# Patient Record
Sex: Female | Born: 1975 | Race: White | Hispanic: No | Marital: Single | Smoking: Never smoker
Health system: Southern US, Community
[De-identification: ages and names within clinical notes are randomized; demographics above are authoritative.]

## PROBLEM LIST (undated history)

## (undated) DIAGNOSIS — R030 Elevated blood-pressure reading, without diagnosis of hypertension: Secondary | ICD-10-CM

## (undated) DIAGNOSIS — J45909 Unspecified asthma, uncomplicated: Secondary | ICD-10-CM

## (undated) DIAGNOSIS — L719 Rosacea, unspecified: Secondary | ICD-10-CM

## (undated) HISTORY — DX: Rosacea, unspecified: L71.9

## (undated) HISTORY — DX: Morbid (severe) obesity due to excess calories: E66.01

## (undated) HISTORY — DX: Unspecified asthma, uncomplicated: J45.909

## (undated) HISTORY — DX: Elevated blood-pressure reading, without diagnosis of hypertension: R03.0

## (undated) HISTORY — PX: OTHER SURGICAL HISTORY: SHX169

---

## 1999-03-04 ENCOUNTER — Other Ambulatory Visit: Admission: RE | Admit: 1999-03-04 | Discharge: 1999-03-04 | Payer: Self-pay | Admitting: *Deleted

## 2001-02-13 ENCOUNTER — Other Ambulatory Visit: Admission: RE | Admit: 2001-02-13 | Discharge: 2001-02-13 | Payer: Self-pay | Admitting: Obstetrics and Gynecology

## 2002-09-03 ENCOUNTER — Other Ambulatory Visit: Admission: RE | Admit: 2002-09-03 | Discharge: 2002-09-03 | Payer: Self-pay | Admitting: Obstetrics and Gynecology

## 2007-02-28 ENCOUNTER — Other Ambulatory Visit: Admission: RE | Admit: 2007-02-28 | Discharge: 2007-02-28 | Payer: Self-pay | Admitting: Obstetrics & Gynecology

## 2008-04-20 ENCOUNTER — Other Ambulatory Visit: Admission: RE | Admit: 2008-04-20 | Discharge: 2008-04-20 | Payer: Self-pay | Admitting: Obstetrics & Gynecology

## 2012-09-03 ENCOUNTER — Encounter: Payer: Self-pay | Admitting: Certified Nurse Midwife

## 2012-09-09 ENCOUNTER — Encounter: Payer: Self-pay | Admitting: Certified Nurse Midwife

## 2012-09-09 ENCOUNTER — Ambulatory Visit (INDEPENDENT_AMBULATORY_CARE_PROVIDER_SITE_OTHER): Payer: BC Managed Care – PPO | Admitting: Certified Nurse Midwife

## 2012-09-09 VITALS — BP 104/64 | Ht 65.75 in | Wt 226.0 lb

## 2012-09-09 DIAGNOSIS — J45909 Unspecified asthma, uncomplicated: Secondary | ICD-10-CM

## 2012-09-09 DIAGNOSIS — Z01419 Encounter for gynecological examination (general) (routine) without abnormal findings: Secondary | ICD-10-CM

## 2012-09-09 MED ORDER — ETONOGESTREL-ETHINYL ESTRADIOL 0.12-0.015 MG/24HR VA RING: VAGINAL_RING | VAGINAL | Status: DC

## 2012-09-09 NOTE — Patient Instructions (Addendum)

## 2012-09-09 NOTE — Progress Notes (Signed)
37 y.o. SingleCaucasian female   G0P0000 here for annual exam.  Periods normal.  NuvaRing working well, desires continuance. No partner change.  No STD screening desired.  Sees PCP yearly for labs and medication management.  Patient's last menstrual period was 09/06/2012.          Sexually active: yes  The current method of family planning is NuvaRing vaginal inserts.    Exercising: no  exercise Last mammogram: none Last pap: 09/06/11 Last BMD: none Alcohol:4-5 a week social only Tobacco:none   Health Maintenance  Topic Date Due  . Pap Smear  11/11/1994  . Influenza Vaccine  02/03/2013  . Tetanus/tdap  06/06/2015    Family History  Problem Relation Age of Onset  . Hypertension Mother     There is no problem list on file for this patient.   Past Medical History  Diagnosis Date  . Asthma     allergy induced    History reviewed. No pertinent past surgical history.  Allergies: Review of patient's allergies indicates no known allergies.  Current Outpatient Prescriptions  Medication Sig Dispense Refill  . Cetirizine HCl (ZYRTEC PO) Take by mouth.      . etonogestrel-ethinyl estradiol (NUVARING) 0.12-0.015 MG/24HR vaginal ring Place 1 each vaginally every 28 (twenty-eight) days. Insert vaginally and leave in place for 3 consecutive weeks, then remove for 1 week.       No current facility-administered medications for this visit.    ROS: A comprehensive review of systems was negative.  Exam:    BP 104/64  Ht 5' 5.75" (1.67 m)  Wt 226 lb (102.513 kg)  BMI 36.76 kg/m2  LMP 09/06/2012 Weight change: @WEIGHTCHANGE @ Last 3 height recordings:  Ht Readings from Last 3 Encounters:  09/09/12 5' 5.75" (1.67 m)   General appearance: alert and cooperative Head: Normocephalic, without obvious abnormality, atraumatic Neck: no adenopathy, supple, symmetrical, trachea midline and thyroid not enlarged, symmetric, no tenderness/mass/nodules Lungs: clear to auscultation bilaterally  no wheezing heard Breasts: normal appearance, no masses or tenderness, Inspection negative, No nipple retraction or dimpling Heart: regular rate and rhythm Abdomen: soft, non-tender; bowel sounds normal; no masses,  no organomegaly Extremities: extremities normal, atraumatic, no cyanosis or edema Skin: Skin color, texture, turgor normal. No rashes or lesions Lymph nodes: Cervical, supraclavicular, and axillary nodes normal. no inguinal nodes palpated Neurologic: Alert and oriented X 3, normal strength and tone. Normal symmetric reflexes. Normal coordination and gait   Pelvic: External genitalia:  no lesions              Urethra: normal appearing urethra with no masses, tenderness or lesions              Bartholins and Skenes: normal, Bartholin's, Urethra, Skene's normal                 Vagina: normal appearing vagina with normal color and discharge, no lesions  Menses present              Cervix: normal appearance              Pap taken: no        Bimanual Exam:  Uterus:  uterus is normal size, shape, consistency and nontender, anteverted                                      Adnexa:    normal adnexa in size, nontender and no  masses                                      Rectovaginal: Confirms                                      Anus:  normal sphincter tone, no lesions  A:Well woman exam Contraception; Nuvaring desired History of allergy induced asthma    P:Reviewed health and wellness pertinent to exam Rx Nuvaring see orders written Rx given patient request Continue PCP follow up   return annually or prn      An After Visit Summary was printed and given to the patient.  Reviewed, TL

## 2012-10-02 ENCOUNTER — Telehealth: Payer: Self-pay | Admitting: Certified Nurse Midwife

## 2012-10-02 MED ORDER — ETONOGESTREL-ETHINYL ESTRADIOL 0.12-0.015 MG/24HR VA RING: VAGINAL_RING | VAGINAL | Status: DC

## 2012-10-02 NOTE — Telephone Encounter (Signed)
Pt was seen the beginning of this year and was given a prescription for her birth control. It was a 3 month supply (mail order). As of May 1 the patient will be switched to [facility] instead of [facility]. The prescription is with [facility]). She was wondering if we could send in a one month supply to a local pharmacy since she is supposed to switch out her Nuvaring this weekend. Please advise. Pt would like to use [facility] on [address].

## 2012-10-02 NOTE — Telephone Encounter (Signed)
Rx sent as instructed per Ortencia Kick ,CNM, Nuvaring to [facility] per E-script. Patient notified of this on VM of CB#.

## 2012-10-02 NOTE — Telephone Encounter (Signed)
See note below. Please advise. sue 

## 2012-10-02 NOTE — Telephone Encounter (Signed)
OK to send one Nuvaring with a refill to pharmacy

## 2013-04-10 ENCOUNTER — Other Ambulatory Visit: Payer: Self-pay

## 2013-09-16 ENCOUNTER — Ambulatory Visit: Payer: BC Managed Care – PPO | Admitting: Certified Nurse Midwife

## 2013-10-03 ENCOUNTER — Ambulatory Visit: Payer: BC Managed Care – PPO | Admitting: Certified Nurse Midwife

## 2013-10-14 ENCOUNTER — Ambulatory Visit: Payer: BC Managed Care – PPO | Admitting: Certified Nurse Midwife

## 2013-10-14 ENCOUNTER — Encounter: Payer: Self-pay | Admitting: Certified Nurse Midwife

## 2013-12-02 ENCOUNTER — Ambulatory Visit (HOSPITAL_COMMUNITY): Payer: Managed Care, Other (non HMO) | Attending: Cardiology | Admitting: Radiology

## 2013-12-02 ENCOUNTER — Other Ambulatory Visit (HOSPITAL_COMMUNITY): Payer: Self-pay | Admitting: Radiology

## 2013-12-02 DIAGNOSIS — I079 Rheumatic tricuspid valve disease, unspecified: Secondary | ICD-10-CM | POA: Insufficient documentation

## 2013-12-02 DIAGNOSIS — I359 Nonrheumatic aortic valve disorder, unspecified: Secondary | ICD-10-CM | POA: Insufficient documentation

## 2013-12-02 DIAGNOSIS — I059 Rheumatic mitral valve disease, unspecified: Secondary | ICD-10-CM | POA: Insufficient documentation

## 2013-12-02 DIAGNOSIS — R011 Cardiac murmur, unspecified: Secondary | ICD-10-CM

## 2013-12-02 NOTE — Progress Notes (Signed)
Echocardiogram performed.  

## 2014-02-11 ENCOUNTER — Other Ambulatory Visit (HOSPITAL_COMMUNITY)
Admission: RE | Admit: 2014-02-11 | Discharge: 2014-02-11 | Disposition: A | Discharge status: Home / Self Care | Payer: Managed Care, Other (non HMO) | Source: Ambulatory Visit | Attending: Obstetrics and Gynecology | Admitting: Obstetrics and Gynecology

## 2014-02-11 ENCOUNTER — Other Ambulatory Visit: Payer: Self-pay | Admitting: Obstetrics and Gynecology

## 2014-02-11 DIAGNOSIS — Z1151 Encounter for screening for human papillomavirus (HPV): Secondary | ICD-10-CM | POA: Insufficient documentation

## 2014-02-11 DIAGNOSIS — Z01419 Encounter for gynecological examination (general) (routine) without abnormal findings: Secondary | ICD-10-CM | POA: Insufficient documentation

## 2014-02-13 LAB — CYTOLOGY - PAP

## 2014-05-13 ENCOUNTER — Encounter: Payer: Self-pay | Admitting: Certified Nurse Midwife

## 2017-07-02 DIAGNOSIS — Z6839 Body mass index (BMI) 39.0-39.9, adult: Secondary | ICD-10-CM | POA: Diagnosis not present

## 2017-07-02 DIAGNOSIS — E669 Obesity, unspecified: Secondary | ICD-10-CM | POA: Diagnosis not present

## 2017-07-02 DIAGNOSIS — R948 Abnormal results of function studies of other organs and systems: Secondary | ICD-10-CM | POA: Diagnosis not present

## 2017-07-03 DIAGNOSIS — R030 Elevated blood-pressure reading, without diagnosis of hypertension: Secondary | ICD-10-CM | POA: Diagnosis not present

## 2017-07-03 DIAGNOSIS — Z6839 Body mass index (BMI) 39.0-39.9, adult: Secondary | ICD-10-CM | POA: Diagnosis not present

## 2017-07-03 DIAGNOSIS — E669 Obesity, unspecified: Secondary | ICD-10-CM | POA: Diagnosis not present

## 2017-07-19 DIAGNOSIS — E669 Obesity, unspecified: Secondary | ICD-10-CM | POA: Diagnosis not present

## 2017-07-19 DIAGNOSIS — Z6837 Body mass index (BMI) 37.0-37.9, adult: Secondary | ICD-10-CM | POA: Diagnosis not present

## 2017-07-19 DIAGNOSIS — I351 Nonrheumatic aortic (valve) insufficiency: Secondary | ICD-10-CM | POA: Diagnosis not present

## 2017-07-19 DIAGNOSIS — R635 Abnormal weight gain: Secondary | ICD-10-CM | POA: Diagnosis not present

## 2017-07-26 DIAGNOSIS — E669 Obesity, unspecified: Secondary | ICD-10-CM | POA: Diagnosis not present

## 2017-07-26 DIAGNOSIS — Z713 Dietary counseling and surveillance: Secondary | ICD-10-CM | POA: Diagnosis not present

## 2017-08-14 DIAGNOSIS — L718 Other rosacea: Secondary | ICD-10-CM | POA: Diagnosis not present

## 2017-08-14 DIAGNOSIS — L853 Xerosis cutis: Secondary | ICD-10-CM | POA: Diagnosis not present

## 2017-08-14 DIAGNOSIS — L218 Other seborrheic dermatitis: Secondary | ICD-10-CM | POA: Diagnosis not present

## 2017-09-20 ENCOUNTER — Ambulatory Visit: Payer: Self-pay | Admitting: Family Medicine

## 2017-09-20 VITALS — BP 118/90 | HR 97 | Temp 98.4°F | Resp 18 | Wt 235.0 lb

## 2017-09-20 DIAGNOSIS — R059 Cough, unspecified: Secondary | ICD-10-CM

## 2017-09-20 DIAGNOSIS — J302 Other seasonal allergic rhinitis: Secondary | ICD-10-CM

## 2017-09-20 DIAGNOSIS — R05 Cough: Secondary | ICD-10-CM

## 2017-09-20 MED ORDER — FLUTICASONE PROPIONATE 50 MCG/ACT NA SUSP: 2.0000 | Freq: Every day | NASAL | 0 refills | Status: DC

## 2017-09-20 MED ORDER — MONTELUKAST SODIUM 10 MG PO TABS: 10.0000 mg | ORAL_TABLET | Freq: Every day | ORAL | 0 refills | Status: DC

## 2017-09-20 MED ORDER — PSEUDOEPH-BROMPHEN-DM 30-2-10 MG/5ML PO SYRP: 10.0000 mL | ORAL_SOLUTION | Freq: Three times a day (TID) | ORAL | 0 refills | Status: DC | PRN

## 2017-09-20 NOTE — Patient Instructions (Signed)

## 2017-09-20 NOTE — Progress Notes (Signed)
Kim HoppingKatherine B Kirk is a 42 y.o. female who presents with cough and known seasonal allergies that have been treated with zyrtec without complete relief of symptoms of sneezing and nasal congestion.  Review of Systems  Constitutional: Negative for chills, fever and malaise/fatigue.  HENT: Positive for congestion.   Eyes: Negative.   Respiratory: Positive for cough.   Cardiovascular: Negative.   Gastrointestinal: Negative.   Genitourinary: Negative.   Musculoskeletal: Negative.   Skin: Negative.   Neurological: Negative.   Endo/Heme/Allergies: Negative.   Psychiatric/Behavioral: Negative.     O: Vitals:   09/20/17 0821  BP: 118/90  Pulse: 97  Resp: 18  Temp: 98.4 F (36.9 C)  SpO2: 97%   Physical Exam  Constitutional: Vital signs are normal. She appears well-developed and well-nourished. No distress.  HENT:  Head: Normocephalic.  Right Ear: Hearing, tympanic membrane, external ear and ear canal normal.  Left Ear: Hearing, tympanic membrane, external ear and ear canal normal.  Nose: Rhinorrhea present.  Mouth/Throat: Uvula is midline and oropharynx is clear and moist. No oropharyngeal exudate.  Eyes: Pupils are equal, round, and reactive to light. Right eye exhibits no discharge. Left eye exhibits no discharge.  Neck: Normal range of motion.  Cardiovascular: Normal rate and regular rhythm.  Pulmonary/Chest: Effort normal and breath sounds normal.  Abdominal: Soft. Bowel sounds are normal.  Musculoskeletal: Normal range of motion.  Lymphadenopathy:    She has cervical adenopathy.  Neurological: She is alert.  Skin: Skin is warm and dry. She is not diaphoretic.  Psychiatric: She has a normal mood and affect.   A:   1. Seasonal allergic rhinitis, unspecified trigger   2. Cough    P: Diagnosis etiology and medication us and indications discussed with patient who verbalized understanding and agrees with POC.  1. Seasonal allergic rhinitis, unspecified trigger -  fluticasone (FLONASE) 50 MCG/ACT nasal spray; Place 2 sprays into both nostrils daily. - montelukast (SINGULAIR) 10 MG tablet; Take 1 tablet (10 mg total) by mouth at bedtime.  2. Cough - montelukast (SINGULAIR) 10 MG tablet; Take 1 tablet (10 mg total) by mouth at bedtime. - brompheniramine-pseudoephedrine-DM 30-2-10 MG/5ML syrup; Take 10 mLs by mouth 3 (three) times daily as needed.

## 2017-10-25 ENCOUNTER — Other Ambulatory Visit: Payer: Self-pay | Admitting: Internal Medicine

## 2017-10-25 DIAGNOSIS — E559 Vitamin D deficiency, unspecified: Secondary | ICD-10-CM | POA: Diagnosis not present

## 2017-10-25 DIAGNOSIS — Z1231 Encounter for screening mammogram for malignant neoplasm of breast: Secondary | ICD-10-CM

## 2017-10-25 DIAGNOSIS — E7800 Pure hypercholesterolemia, unspecified: Secondary | ICD-10-CM | POA: Diagnosis not present

## 2017-10-25 DIAGNOSIS — J309 Allergic rhinitis, unspecified: Secondary | ICD-10-CM | POA: Diagnosis not present

## 2017-10-25 DIAGNOSIS — R011 Cardiac murmur, unspecified: Secondary | ICD-10-CM | POA: Diagnosis not present

## 2017-10-25 DIAGNOSIS — E669 Obesity, unspecified: Secondary | ICD-10-CM | POA: Diagnosis not present

## 2017-11-01 ENCOUNTER — Ambulatory Visit
Admission: RE | Admit: 2017-11-01 | Discharge: 2017-11-01 | Disposition: A | Discharge status: Home / Self Care | Payer: BLUE CROSS/BLUE SHIELD | Source: Ambulatory Visit | Attending: Internal Medicine | Admitting: Internal Medicine

## 2017-11-01 DIAGNOSIS — Z1231 Encounter for screening mammogram for malignant neoplasm of breast: Secondary | ICD-10-CM | POA: Diagnosis not present

## 2018-01-09 ENCOUNTER — Ambulatory Visit: Payer: Self-pay | Admitting: Family Medicine

## 2018-01-09 VITALS — BP 112/74 | HR 73 | Temp 98.2°F | Resp 18 | Wt 236.4 lb

## 2018-01-09 DIAGNOSIS — M542 Cervicalgia: Secondary | ICD-10-CM

## 2018-01-09 MED ORDER — CYCLOBENZAPRINE HCL 10 MG PO TABS: 10.0000 mg | ORAL_TABLET | Freq: Three times a day (TID) | ORAL | 0 refills | Status: DC | PRN

## 2018-01-09 MED ORDER — MELOXICAM 15 MG PO TABS: 15.0000 mg | ORAL_TABLET | Freq: Every day | ORAL | 0 refills | Status: AC

## 2018-01-09 NOTE — Progress Notes (Signed)
Kim Kirk is a 42 y.o. female who presents today with concerns of neck pain for 5 days, She has trialed oral motrin 800 mg twice daily, heat, ice, and a plethora of over the counter topicals. She has purchased a new neck pillow in the last 72 hours ago to try to alter her sleeping position. She denies a history of trauma to this area- acute or chronic. Information of note is that she does confirm the purchanse new mattress in last 30 days. She does like the mattress and reports an overall good nights sleep.  Review of Systems  Constitutional: Negative for chills, fever and malaise/fatigue.  HENT: Negative for congestion, ear discharge, ear pain, sinus pain and sore throat.   Eyes: Negative.   Respiratory: Negative for cough, sputum production and shortness of breath.   Cardiovascular: Negative.  Negative for chest pain.  Gastrointestinal: Negative for abdominal pain, diarrhea, nausea and vomiting.  Genitourinary: Negative for dysuria, frequency, hematuria and urgency.  Musculoskeletal: Positive for neck pain. Negative for myalgias.  Skin: Negative.   Neurological: Negative for headaches.  Endo/Heme/Allergies: Negative.   Psychiatric/Behavioral: Negative.     O: Vitals:   01/09/18 1041  BP: 112/74  Pulse: 73  Resp: 18  Temp: 98.2 F (36.8 C)  SpO2: 97%     Physical Exam  Constitutional: She is oriented to person, place, and time. Vital signs are normal. She appears well-developed and well-nourished. She is active.  Non-toxic appearance. She does not have a sickly appearance.  HENT:  Head: Normocephalic.  Right Ear: Hearing, tympanic membrane, external ear and ear canal normal.  Left Ear: Hearing, tympanic membrane, external ear and ear canal normal.  Nose: Nose normal.  Mouth/Throat: Uvula is midline and oropharynx is clear and moist.  Neck: Normal range of motion. Neck supple.  Cardiovascular: Normal rate, regular rhythm, normal heart sounds and normal pulses.   Pulmonary/Chest: Effort normal and breath sounds normal.  Abdominal: Soft. Bowel sounds are normal.  Musculoskeletal: Normal range of motion.       Cervical back: She exhibits tenderness. She exhibits normal range of motion, no bony tenderness, no swelling, no edema, no deformity, no laceration, no pain, no spasm and normal pulse.  Full ROM but pain lateral and anterior flexion. No visual deformities, edema or erythema, skin is intact and no bony protrusions. Otherwise normal cervical exam.   Lymphadenopathy:       Head (right side): No submental and no submandibular adenopathy present.       Head (left side): No submental and no submandibular adenopathy present.    She has no cervical adenopathy.  Neurological: She is alert and oriented to person, place, and time. No cranial nerve deficit or sensory deficit. GCS eye subscore is 4. GCS verbal subscore is 5. GCS motor subscore is 6.  Psychiatric: She has a normal mood and affect.  Vitals reviewed.  A: 1. Neck pain without injury    Discussed exam findings, diagnosis etiology and medication use and indications reviewed with patient. Follow- Up and discharge instructions provided. No emergent/urgent issues found on exam.  Patient verbalized understanding of information provided and agrees with plan of care (POC), all questions answered.  1. Neck pain without injury Will treat as simple strain and advised pt to trial medications provided and f/u if symptoms persist or are unimproved. - meloxicam (MOBIC) 15 MG tablet; Take 1 tablet (15 mg total) by mouth daily for 10 days. - cyclobenzaprine (FLEXERIL) 10 MG tablet; Take 1 tablet (  10 mg total) by mouth 3 (three) times daily as needed for muscle spasms.

## 2018-01-09 NOTE — Patient Instructions (Signed)
Cervical Strain and Sprain Rehab Ask your health care provider which exercises are safe for you. Do exercises exactly as told by your health care provider and adjust them as directed. It is normal to feel mild stretching, pulling, tightness, or discomfort as you do these exercises, but you should stop right away if you feel sudden pain or your pain gets worse.Do not begin these exercises until told by your health care provider. Stretching and range of motion exercises These exercises warm up your muscles and joints and improve the movement and flexibility of your neck. These exercises also help to relieve pain, numbness, and tingling. Exercise A: Cervical side bend  1. Using good posture, sit on a stable chair or stand up. 2. Without moving your shoulders, slowly tilt your left / right ear to your shoulder until you feel a stretch in your neck muscles. You should be looking straight ahead. 3. Hold for __________ seconds. 4. Repeat with the other side of your neck. Repeat __________ times. Complete this exercise __________ times a day. Exercise B: Cervical rotation  1. Using good posture, sit on a stable chair or stand up. 2. Slowly turn your head to the side as if you are looking over your left / right shoulder. ? Keep your eyes level with the ground. ? Stop when you feel a stretch along the side and the back of your neck. 3. Hold for __________ seconds. 4. Repeat this by turning to your other side. Repeat __________ times. Complete this exercise __________ times a day. Exercise C: Thoracic extension and pectoral stretch 1. Roll a towel or a small blanket so it is about 4 inches (10 cm) in diameter. 2. Lie down on your back on a firm surface. 3. Put the towel lengthwise, under your spine in the middle of your back. It should not be not under your shoulder blades. The towel should line up with your spine from your middle back to your lower back. 4. Put your hands behind your head and let your  elbows fall out to your sides. 5. Hold for __________ seconds. Repeat __________ times. Complete this exercise __________ times a day. Strengthening exercises These exercises build strength and endurance in your neck. Endurance is the ability to use your muscles for a long time, even after your muscles get tired. Exercise D: Upper cervical flexion, isometric 1. Lie on your back with a thin pillow behind your head and a small rolled-up towel under your neck. 2. Gently tuck your chin toward your chest and nod your head down to look toward your feet. Do not lift your head off the pillow. 3. Hold for __________ seconds. 4. Release the tension slowly. Relax your neck muscles completely before you repeat this exercise. Repeat __________ times. Complete this exercise __________ times a day. Exercise E: Cervical extension, isometric  1. Stand about 6 inches (15 cm) away from a wall, with your back facing the wall. 2. Place a soft object, about 6-8 inches (15-20 cm) in diameter, between the back of your head and the wall. A soft object could be a small pillow, a ball, or a folded towel. 3. Gently tilt your head back and press into the soft object. Keep your jaw and forehead relaxed. 4. Hold for __________ seconds. 5. Release the tension slowly. Relax your neck muscles completely before you repeat this exercise. Repeat __________ times. Complete this exercise __________ times a day. Posture and body mechanics  Body mechanics refers to the movements and positions of   your body while you do your daily activities. Posture is part of body mechanics. Good posture and healthy body mechanics can help to relieve stress in your body's tissues and joints. Good posture means that your spine is in its natural S-curve position (your spine is neutral), your shoulders are pulled back slightly, and your head is not tipped forward. The following are general guidelines for applying improved posture and body mechanics to  your everyday activities. Standing  When standing, keep your spine neutral and keep your feet about hip-width apart. Keep a slight bend in your knees. Your ears, shoulders, and hips should line up.  When you do a task in which you stand in one place for a long time, place one foot up on a stable object that is 2-4 inches (5-10 cm) high, such as a footstool. This helps keep your spine neutral. Sitting   When sitting, keep your spine neutral and your keep feet flat on the floor. Use a footrest, if necessary, and keep your thighs parallel to the floor. Avoid rounding your shoulders, and avoid tilting your head forward.  When working at a desk or a computer, keep your desk at a height where your hands are slightly lower than your elbows. Slide your chair under your desk so you are close enough to maintain good posture.  When working at a computer, place your monitor at a height where you are looking straight ahead and you do not have to tilt your head forward or downward to look at the screen. Resting When lying down and resting, avoid positions that are most painful for you. Try to support your neck in a neutral position. You can use a contour pillow or a small rolled-up towel. Your pillow should support your neck but not push on it. This information is not intended to replace advice given to you by your health care provider. Make sure you discuss any questions you have with your health care provider. Document Released: 05/22/2005 Document Revised: 01/27/2016 Document Reviewed: 04/28/2015 Elsevier Interactive Patient Education  2018 [employer]  

## 2018-01-11 ENCOUNTER — Telehealth: Payer: Self-pay

## 2018-01-11 NOTE — Telephone Encounter (Signed)
I left a message asking the patient asking to call us back if she has any questions or concerns. 

## 2018-03-14 ENCOUNTER — Encounter (HOSPITAL_COMMUNITY): Payer: Self-pay

## 2018-03-14 ENCOUNTER — Emergency Department (HOSPITAL_COMMUNITY)
Admission: EM | Admit: 2018-03-14 | Discharge: 2018-03-15 | Disposition: A | Discharge status: Home / Self Care | Payer: BLUE CROSS/BLUE SHIELD | Attending: Emergency Medicine | Admitting: Emergency Medicine

## 2018-03-14 ENCOUNTER — Emergency Department (HOSPITAL_COMMUNITY): Payer: BLUE CROSS/BLUE SHIELD

## 2018-03-14 ENCOUNTER — Other Ambulatory Visit: Payer: Self-pay

## 2018-03-14 DIAGNOSIS — Z79899 Other long term (current) drug therapy: Secondary | ICD-10-CM | POA: Insufficient documentation

## 2018-03-14 DIAGNOSIS — R1030 Lower abdominal pain, unspecified: Secondary | ICD-10-CM | POA: Diagnosis present

## 2018-03-14 DIAGNOSIS — K5792 Diverticulitis of intestine, part unspecified, without perforation or abscess without bleeding: Secondary | ICD-10-CM

## 2018-03-14 DIAGNOSIS — J45909 Unspecified asthma, uncomplicated: Secondary | ICD-10-CM | POA: Insufficient documentation

## 2018-03-14 DIAGNOSIS — K760 Fatty (change of) liver, not elsewhere classified: Secondary | ICD-10-CM | POA: Diagnosis not present

## 2018-03-14 DIAGNOSIS — K5732 Diverticulitis of large intestine without perforation or abscess without bleeding: Secondary | ICD-10-CM | POA: Diagnosis not present

## 2018-03-14 LAB — COMPREHENSIVE METABOLIC PANEL
ALT: 22 U/L (ref 0–44)
AST: 19 U/L (ref 15–41)
Albumin: 4.2 g/dL (ref 3.5–5.0)
Alkaline Phosphatase: 57 U/L (ref 38–126)
Anion gap: 12 (ref 5–15)
BUN: 10 mg/dL (ref 6–20)
CHLORIDE: 103 mmol/L (ref 98–111)
CO2: 22 mmol/L (ref 22–32)
Calcium: 9.1 mg/dL (ref 8.9–10.3)
Creatinine, Ser: 0.81 mg/dL (ref 0.44–1.00)
Glucose, Bld: 128 mg/dL — ABNORMAL HIGH (ref 70–99)
POTASSIUM: 3.7 mmol/L (ref 3.5–5.1)
Sodium: 137 mmol/L (ref 135–145)
Total Bilirubin: 0.8 mg/dL (ref 0.3–1.2)
Total Protein: 7.6 g/dL (ref 6.5–8.1)

## 2018-03-14 LAB — CBC
HCT: 42.7 % (ref 36.0–46.0)
HEMOGLOBIN: 14.1 g/dL (ref 12.0–15.0)
MCH: 31.6 pg (ref 26.0–34.0)
MCHC: 33 g/dL (ref 30.0–36.0)
MCV: 95.7 fL (ref 80.0–100.0)
NRBC: 0 % (ref 0.0–0.2)
Platelets: 280 10*3/uL (ref 150–400)
RBC: 4.46 MIL/uL (ref 3.87–5.11)
RDW: 11.9 % (ref 11.5–15.5)
WBC: 11.4 10*3/uL — AB (ref 4.0–10.5)

## 2018-03-14 LAB — URINALYSIS, ROUTINE W REFLEX MICROSCOPIC
Bilirubin Urine: NEGATIVE
GLUCOSE, UA: NEGATIVE mg/dL
Hgb urine dipstick: NEGATIVE
KETONES UR: NEGATIVE mg/dL
LEUKOCYTES UA: NEGATIVE
NITRITE: NEGATIVE
Protein, ur: NEGATIVE mg/dL
SPECIFIC GRAVITY, URINE: 1.009 (ref 1.005–1.030)
pH: 6 (ref 5.0–8.0)

## 2018-03-14 LAB — LIPASE, BLOOD: Lipase: 37 U/L (ref 11–51)

## 2018-03-14 LAB — I-STAT BETA HCG BLOOD, ED (MC, WL, AP ONLY): I-stat hCG, quantitative: <5 m[IU]/mL (ref <5)

## 2018-03-14 MED ORDER — IOHEXOL 300 MG/ML  SOLN
30.0000 mL | Freq: Once | INTRAMUSCULAR | Status: AC | PRN
  Administered 2018-03-14: 30 mL via ORAL

## 2018-03-14 NOTE — ED Triage Notes (Signed)
Patient c/o bilateral lower abdominal pain/bloated since last night. Patient denies any N/V/D. Patient states she feels like she needs to pass gas, but can not.

## 2018-03-14 NOTE — ED Notes (Signed)
PATIENT AWARE URINE SAMPLE NEEDED.

## 2018-03-14 NOTE — ED Provider Notes (Signed)
[facility]-EMERGENCY DEPT Provider Note   CSN: [id number] Arrival date & time: 03/14/18  1607     History   Chief Complaint Chief Complaint  Patient presents with  . Abdominal Pain    HPI Kim Kirk is a 42 y.o. female woman with no significant past medical history presenting for evaluation of lower abdominal cramps.  She reports that about 9 PM yesterday she had a sudden onset lower abdominal pain which feels like cramps or bloating and persisted throughout the night where she felt like she could not pass gas.  The cramps was worse with ambulation, sitting, moving and nothing relieved it.  She denies nausea, vomiting, diarrhea, fevers or chills but does endorse anorexia.  She has regular bowel movement but did notice that she had to strain yesterday afternoon to have a bowel movement.  She denies dysuria, urinary frequency or urgency.  Also denies any abdominal surgeries.  Prior to the onset of symptoms she had gone to [place] to eat with some friends.   Past Medical History:  Diagnosis Date  . Asthma    allergy induced    Patient Active Problem List   Diagnosis Date Noted  . Asthma 09/09/2012    History reviewed. No pertinent surgical history.   OB History    Gravida  0   Para  0   Term  0   Preterm  0   AB  0   Living  0     SAB  0   TAB  0   Ectopic  0   Multiple  0   Live Births               Home Medications    Prior to Admission medications   Medication Sig Start Date End Date Taking? Authorizing Provider  Cetirizine HCl (ZYRTEC PO) Take by mouth.   Yes [provider]  amoxicillin-clavulanate (AUGMENTIN) 500-125 MG tablet Take 1 tablet (500 mg total) by mouth 3 (three) times daily for 10 days. 03/15/18 03/25/18  Yvette Rack, MD  cyclobenzaprine (FLEXERIL) 10 MG tablet Take 1 tablet (10 mg total) by mouth 3 (three) times daily as needed for muscle spasms. 01/09/18   Zachery Dauer, NP    ondansetron (ZOFRAN) 4 MG tablet Take 1 tablet (4 mg total) by mouth every 8 (eight) hours as needed for nausea or vomiting. 03/15/18   Yvette Rack, MD    Family History Family History  Problem Relation Age of Onset  . Hypertension Mother     Social History Social History   Tobacco Use  . Smoking status: Never Smoker  . Smokeless tobacco: Never Used  Substance Use Topics  . Alcohol use: Yes    Alcohol/week: 5.0 standard drinks    Types: 5 Standard drinks or equivalent per week  . Drug use: No     Allergies   Patient has no known allergies.   Review of Systems Review of Systems  Constitutional: Negative.   HENT: Negative.   Eyes: Negative.   Respiratory: Negative.   Cardiovascular: Negative.   Gastrointestinal: Positive for abdominal pain (lower abdominal cramps) and constipation.  Genitourinary: Negative.   Musculoskeletal: Negative.   Skin: Negative.  Color change: ?facial flushing.  Neurological: Negative.   Psychiatric/Behavioral: Negative.      Physical Exam Updated Vital Signs BP 130/88   Pulse (!) 105   Temp 98.8 F (37.1 C) (Oral)   Resp 16   Ht 5\' 5"  (1.651  m)   Wt 108.9 kg   LMP 02/28/2018   SpO2 99%   BMI 39.94 kg/m   Physical Exam  Constitutional: She appears well-developed and well-nourished.  Non-toxic appearance. She does not appear ill. She appears distressed (moderately distressed secondary to abdominal cramps ).  HENT:  Head: Normocephalic and atraumatic.  Cardiovascular: Normal rate and regular rhythm.  Murmur (systolic murmur 4/6 throughout ) heard. Pulmonary/Chest: Effort normal and breath sounds normal.  Abdominal: Soft. Normal appearance and bowel sounds are normal. There is tenderness (suprapubic, RLQ). There is tenderness at McBurney's point (questionable ).  Neurological: She is alert.  Skin: Skin is warm.  Psychiatric: She has a normal mood and affect. Her behavior is normal.     ED Treatments / Results  Labs (all  labs ordered are listed, but only abnormal results are displayed) Labs Reviewed  COMPREHENSIVE METABOLIC PANEL - Abnormal; Notable for the following components:      Result Value   Glucose, Bld 128 (*)    All other components within normal limits  CBC - Abnormal; Notable for the following components:   WBC 11.4 (*)    All other components within normal limits  LIPASE, BLOOD  URINALYSIS, ROUTINE W REFLEX MICROSCOPIC  I-STAT BETA HCG BLOOD, ED ([facility], [facility], [facility] ONLY)    EKG None  Radiology Ct Abdomen Pelvis Wo Contrast  Result Date: 03/15/2018 CLINICAL DATA:  Lower abdominal pain. EXAM: CT ABDOMEN AND PELVIS WITHOUT CONTRAST TECHNIQUE: Multidetector CT imaging of the abdomen and pelvis was performed following the standard protocol without IV contrast. COMPARISON:  None. FINDINGS: LOWER CHEST: There is no basilar pleural or apical pericardial effusion. HEPATOBILIARY: Diffuse hypoattenuation of the liver is consistent with hepatic steatosis. No focal liver lesion or biliary dilatation. The gallbladder is normal. PANCREAS: The pancreatic parenchymal contours are normal and there is no ductal dilatation. There is no peripancreatic fluid collection. SPLEEN: Normal. ADRENALS/URINARY TRACT: --Adrenal glands: Normal. --Right kidney/ureter: No hydronephrosis, nephroureterolithiasis, perinephric stranding or solid renal mass. --Left kidney/ureter: No hydronephrosis, nephroureterolithiasis, perinephric stranding or solid renal mass. --Urinary bladder: Normal for degree of distention STOMACH/BOWEL: --Stomach/Duodenum: There is no hiatal hernia or other gastric abnormality. The duodenal course and caliber are normal. --Small bowel: No dilatation or inflammation. --Colon: There is rectosigmoid diverticulosis with wall thickening and adjacent inflammatory stranding (series 2, image 74). The remainder of the colon is normal. --Appendix: Normal. VASCULAR/LYMPHATIC: Normal course and caliber of the major abdominal  vessels. No abdominal or pelvic lymphadenopathy. REPRODUCTIVE: Normal uterus and ovaries. MUSCULOSKELETAL. No bony spinal canal stenosis or focal osseous abnormality. OTHER: None. IMPRESSION: 1. Mild inflammatory stranding adjacent to the sigmoid colon, likely early acute diverticulitis. 2. Attic steatosis. Electronically Signed   By: Deatra Robinson M.D.   On: 03/15/2018 00:12    Procedures Procedures (including critical care time)  Medications Ordered in ED Medications  iohexol (OMNIPAQUE) 300 MG/ML solution 30 mL (30 mLs Oral Contrast Given 03/14/18 2134)     Initial Impression / Assessment and Plan / ED Course  I have reviewed the triage vital signs and the nursing notes.  Pertinent labs & imaging results that were available during my care of the patient were reviewed by me and considered in my medical decision making (see chart for details).   42 year old pleasant woman presented with an acute onset lower abdominal cramps and bloating sensation.  Denies recent abdominal surgeries, nausea, vomiting, fevers, chills but does endorse anorexia.  Also reported that she had to strain yesterday to have a  bowel movement.  Physical exam she has suprapubic and right lower quadrant tenderness.  Labs showed negative beta hCG, normal lipase, mild leukocytosis. CT abdomen and pelvis was ordered given concern for appendicitis but it revealed Mild inflammatory stranding adjacent to the sigmoid colon, likely early acute diverticulitis.   She is clinically and hemodynamically stable. Safe to discharge with 10 day course of augmentin.   Final Clinical Impressions(s) / ED Diagnoses   Final diagnoses:  Diverticulitis    ED Discharge Orders         Ordered    amoxicillin-clavulanate (AUGMENTIN) 500-125 MG tablet  3 times daily     03/15/18 0031    ondansetron (ZOFRAN) 4 MG tablet  Every 8 hours PRN     03/15/18 0031           Yvette Rack, MD 03/15/18 1610    Tilden Fossa, MD 03/19/18  2329

## 2018-03-15 MED ORDER — ONDANSETRON HCL 4 MG PO TABS: 4.0000 mg | ORAL_TABLET | Freq: Three times a day (TID) | ORAL | 0 refills | Status: DC | PRN

## 2018-03-15 MED ORDER — AMOXICILLIN-POT CLAVULANATE 500-125 MG PO TABS: 1.0000 | ORAL_TABLET | Freq: Three times a day (TID) | ORAL | 0 refills | Status: AC

## 2018-03-15 MED ORDER — ACETAMINOPHEN 325 MG PO TABS
650.0000 mg | ORAL_TABLET | Freq: Once | ORAL | Status: AC
  Administered 2018-03-15: 650 mg via ORAL
  Filled 2018-03-15: qty 2

## 2018-03-15 NOTE — Discharge Instructions (Addendum)
Kim Kirk,   It was a pleasure seeing care of you here in the ED.  CT scan will be dictated as we discussed showed early diverticulitis.  I am discharging you with nausea medication and antibiotics.   If you begin experiencing worsening abdominal pain, fevers, chills, nausea, vomiting, diarrhea please return back to the emergency department.  ~Take Care Dr. Dortha Schwalbe

## 2018-04-04 DIAGNOSIS — K5792 Diverticulitis of intestine, part unspecified, without perforation or abscess without bleeding: Secondary | ICD-10-CM | POA: Diagnosis not present

## 2018-04-04 DIAGNOSIS — R109 Unspecified abdominal pain: Secondary | ICD-10-CM | POA: Diagnosis not present

## 2018-04-25 DIAGNOSIS — K5792 Diverticulitis of intestine, part unspecified, without perforation or abscess without bleeding: Secondary | ICD-10-CM | POA: Diagnosis not present

## 2018-08-09 DIAGNOSIS — K5732 Diverticulitis of large intestine without perforation or abscess without bleeding: Secondary | ICD-10-CM | POA: Diagnosis not present

## 2018-08-09 DIAGNOSIS — K5730 Diverticulosis of large intestine without perforation or abscess without bleeding: Secondary | ICD-10-CM | POA: Diagnosis not present

## 2018-12-18 DIAGNOSIS — L718 Other rosacea: Secondary | ICD-10-CM | POA: Diagnosis not present

## 2019-04-29 ENCOUNTER — Other Ambulatory Visit: Payer: Self-pay

## 2019-04-29 DIAGNOSIS — Z20822 Contact with and (suspected) exposure to covid-19: Secondary | ICD-10-CM

## 2019-05-01 LAB — NOVEL CORONAVIRUS, NAA: SARS-CoV-2, NAA: NOT DETECTED

## 2019-08-08 ENCOUNTER — Ambulatory Visit: Payer: Self-pay | Attending: Internal Medicine

## 2019-08-08 DIAGNOSIS — Z23 Encounter for immunization: Secondary | ICD-10-CM | POA: Insufficient documentation

## 2019-08-08 NOTE — Progress Notes (Signed)
   Covid-19 Vaccination Clinic  Name:  Kim Kirk    MRN: [id number] DOB: 1975-10-06  08/08/2019  Ms. Strohm was observed post Covid-19 immunization for 15 minutes without incident. She was provided with Vaccine Information Sheet and instruction to access the V-Safe system.   Ms. Feggins was instructed to call 911 with any severe reactions post vaccine: Marland Kitchen Difficulty breathing  . Swelling of face and throat  . A fast heartbeat  . A bad rash all over body  . Dizziness and weakness

## 2019-09-03 ENCOUNTER — Ambulatory Visit: Payer: Self-pay | Attending: Internal Medicine

## 2019-09-03 DIAGNOSIS — Z23 Encounter for immunization: Secondary | ICD-10-CM

## 2019-09-03 NOTE — Progress Notes (Signed)
   Covid-19 Vaccination Clinic  Name:  Kim Kirk    MRN: [id number] DOB: [birth year]  09/03/2019  [name] was observed post Covid-19 immunization for 15 minutes without incident. She was provided with Vaccine Information Sheet and instruction to access the V-Safe system.   [name] was instructed to call 911 with any severe reactions post vaccine: Marland Kitchen Difficulty breathing  . Swelling of face and throat  . A fast heartbeat  . A bad rash all over body  . Dizziness and weakness   Immunizations Administered    Name Date Dose VIS Date Route   Pfizer COVID-19 Vaccine 09/03/2019 11:49 AM 0.3 mL 05/16/2019 Intramuscular   Manufacturer: [employer], [employer]   Lot: [id number]   NDC: [address]-4

## 2019-09-22 DIAGNOSIS — K5792 Diverticulitis of intestine, part unspecified, without perforation or abscess without bleeding: Secondary | ICD-10-CM | POA: Diagnosis not present

## 2019-12-18 ENCOUNTER — Ambulatory Visit: Payer: Self-pay | Admitting: Internal Medicine

## 2020-01-05 DIAGNOSIS — L820 Inflamed seborrheic keratosis: Secondary | ICD-10-CM | POA: Diagnosis not present

## 2020-01-05 DIAGNOSIS — R21 Rash and other nonspecific skin eruption: Secondary | ICD-10-CM | POA: Diagnosis not present

## 2020-01-05 DIAGNOSIS — D225 Melanocytic nevi of trunk: Secondary | ICD-10-CM | POA: Diagnosis not present

## 2020-01-05 DIAGNOSIS — L858 Other specified epidermal thickening: Secondary | ICD-10-CM | POA: Diagnosis not present

## 2020-01-08 ENCOUNTER — Other Ambulatory Visit: Payer: Self-pay

## 2020-01-08 ENCOUNTER — Encounter: Payer: Self-pay | Admitting: Internal Medicine

## 2020-01-08 ENCOUNTER — Ambulatory Visit: Payer: BC Managed Care – PPO | Admitting: Internal Medicine

## 2020-01-08 VITALS — BP 130/90 | HR 88 | Temp 97.8°F | Ht 65.0 in | Wt 226.0 lb

## 2020-01-08 DIAGNOSIS — R739 Hyperglycemia, unspecified: Secondary | ICD-10-CM | POA: Diagnosis not present

## 2020-01-08 DIAGNOSIS — L719 Rosacea, unspecified: Secondary | ICD-10-CM

## 2020-01-08 DIAGNOSIS — Z0000 Encounter for general adult medical examination without abnormal findings: Secondary | ICD-10-CM | POA: Diagnosis not present

## 2020-01-08 DIAGNOSIS — R011 Cardiac murmur, unspecified: Secondary | ICD-10-CM

## 2020-01-08 DIAGNOSIS — Z1239 Encounter for other screening for malignant neoplasm of breast: Secondary | ICD-10-CM | POA: Diagnosis not present

## 2020-01-08 DIAGNOSIS — R030 Elevated blood-pressure reading, without diagnosis of hypertension: Secondary | ICD-10-CM | POA: Insufficient documentation

## 2020-01-08 LAB — POCT GLYCOSYLATED HEMOGLOBIN (HGB A1C): Hemoglobin A1C: 5.3 % (ref 4.0–5.6)

## 2020-01-08 MED ORDER — CETIRIZINE HCL 10 MG PO TABS: 10.0000 mg | ORAL_TABLET | Freq: Every day | ORAL | 1 refills | Status: DC

## 2020-01-08 NOTE — Progress Notes (Signed)
New Patient Office Visit     This visit occurred during the SARS-CoV-2 public health emergency.  Safety protocols were in place, including screening questions prior to the visit, additional usage of staff PPE, and extensive cleaning of exam room while observing appropriate contact time as indicated for disinfecting solutions.    CC/Reason for Visit: Establish care, annual preventive exam Previous PCP: Dr. Jannetta Quint Last Visit: Unknown  HPI: Kim Kirk is a 44 y.o. female who is coming in today for the above mentioned reasons. Past Medical History is significant for: Obesity.  She is concerned about her glucose, cholesterol levels due to family history.  She is concerned about weight.  She states she had an echocardiogram over 6 years ago due to a murmur and was told she had 3 leaky valves and needed follow-up echo.  She has been taking Contrave through an online service for weight loss as well as doxycycline for rosacea.  She has had both of her Covid vaccines, she had a tetanus in 2007, she is overdue for mammogram.   Past Medical/Surgical History: Past Medical History:  Diagnosis Date  . Asthma    allergy induced  . Elevated BP without diagnosis of hypertension   . Morbid obesity ([place])   . Rosacea     No past surgical history on file.  Social History:  reports that she has never smoked. She has never used smokeless tobacco. She reports current alcohol use of about 5.0 standard drinks of alcohol per week. She reports that she does not use drugs.  Allergies: Allergies  Allergen Reactions  . Flagyl [Metronidazole] Rash    Family History:  Family History  Problem Relation Age of Onset  . Hypertension Mother      Current Outpatient Medications:  .  buPROPion (WELLBUTRIN XL) 150 MG 24 hr tablet, Take 150 mg by mouth daily., Disp: , Rfl:  .  doxycycline (VIBRA-TABS) 100 MG tablet, Take 100 mg by mouth 2 (two) times daily., Disp: , Rfl:  .  naltrexone (DEPADE)  50 MG tablet, Take 25 mg by mouth daily., Disp: , Rfl:  .  cetirizine (ZYRTEC ALLERGY) 10 MG tablet, Take 1 tablet (10 mg total) by mouth daily., Disp: 90 tablet, Rfl: 1  Review of Systems:  Constitutional: Denies fever, chills, diaphoresis, appetite change and fatigue.  HEENT: Denies photophobia, eye pain, redness, hearing loss, ear pain, congestion, sore throat, rhinorrhea, sneezing, mouth sores, trouble swallowing, neck pain, neck stiffness and tinnitus.   Respiratory: Denies SOB, DOE, cough, chest tightness,  and wheezing.   Cardiovascular: Denies chest pain, palpitations and leg swelling.  Gastrointestinal: Denies nausea, vomiting, abdominal pain, diarrhea, constipation, blood in stool and abdominal distention.  Genitourinary: Denies dysuria, urgency, frequency, hematuria, flank pain and difficulty urinating.  Endocrine: Denies: hot or cold intolerance, sweats, changes in hair or nails, polyuria, polydipsia. Musculoskeletal: Denies myalgias, back pain, joint swelling, arthralgias and gait problem.  Skin: Denies pallor, rash and wound.  Neurological: Denies dizziness, seizures, syncope, weakness, light-headedness, numbness and headaches.  Hematological: Denies adenopathy. Easy bruising, personal or family bleeding history  Psychiatric/Behavioral: Denies suicidal ideation, mood changes, confusion, nervousness, sleep disturbance and agitation    Physical Exam: Vitals:   01/08/20 1435  BP: 130/90  Pulse: 88  Temp: 97.8 F (36.6 C)  TempSrc: Oral  SpO2: 97%  Weight: 226 lb (102.5 kg)  Height: '5\' 5"'  (1.651 m)   Body mass index is 37.61 kg/m.  Constitutional: NAD, calm, comfortable Eyes: PERRL, lids and  conjunctivae normal ENMT: Mucous membranes are moist.Tympanic membrane is pearly white, no erythema or bulging. Neck: normal, supple, no masses, no thyromegaly Respiratory: clear to auscultation bilaterally, no wheezing, no crackles. Normal respiratory effort. No accessory muscle  use.  Cardiovascular: Regular rate and rhythm, diastolic murmur present, no rubs / gallops. No extremity edema. 2+ pedal pulses. .  Abdomen: no tenderness, no masses palpated. No hepatosplenomegaly. Bowel sounds positive.  Musculoskeletal: no clubbing / cyanosis. No joint deformity upper and lower extremities. Good ROM, no contractures. Normal muscle tone.  Skin: no rashes, lesions, ulcers. No induration Neurologic: Grossly intact and nonfocal. Psychiatric: Normal judgment and insight. Alert and oriented x 3. Normal mood.    Impression and Plan:  Screening breast examination  - Plan: MM Digital Screening  Encounter for preventive health examination -Have advised routine eye care, she has routine dental care. -Immunizations are up-to-date. -Screening labs today. -Healthy lifestyle discussed in detail. -She is overdue for mammogram. -Commence routine colon cancer screening age 27 although she had a colonoscopy in 2020 due to diverticulitis.  Morbid obesity ([place]) -Discussed healthy lifestyle, including increased physical activity and better food choices to promote weight loss.  Elevated BP without diagnosis of hypertension -She will do ambulatory blood pressure monitoring and return in 3 months for follow-up.  Rosacea -On doxycycline  Murmur, cardiac  - Plan: ECHOCARDIOGRAM COMPLETE    Patient Instructions  -Nice seeing you today!!  -Lab work today; will notify you once results are available.  -check BP at home and bring chart in when you return.  -ECHO ordered.  -Schedule follow up in 3 months.   Preventive Care 26-55 Years Old, Female Preventive care refers to visits with your health care provider and lifestyle choices that can promote health and wellness. This includes:  A yearly physical exam. This may also be called an annual well check.  Regular dental visits and eye exams.  Immunizations.  Screening for certain conditions.  Healthy lifestyle choices,  such as eating a healthy diet, getting regular exercise, not using drugs or products that contain nicotine and tobacco, and limiting alcohol use. What can I expect for my preventive care visit? Physical exam Your health care provider will check your:  Height and weight. This may be used to calculate body mass index (BMI), which tells if you are at a healthy weight.  Heart rate and blood pressure.  Skin for abnormal spots. Counseling Your health care provider may ask you questions about your:  Alcohol, tobacco, and drug use.  Emotional well-being.  Home and relationship well-being.  Sexual activity.  Eating habits.  Work and work Statistician.  Method of birth control.  Menstrual cycle.  Pregnancy history. What immunizations do I need?  Influenza (flu) vaccine  This is recommended every year. Tetanus, diphtheria, and pertussis (Tdap) vaccine  You may need a Td booster every 10 years. Varicella (chickenpox) vaccine  You may need this if you have not been vaccinated. Zoster (shingles) vaccine  You may need this after age 49. Measles, mumps, and rubella (MMR) vaccine  You may need at least one dose of MMR if you were born in 1957 or later. You may also need a second dose. Pneumococcal conjugate (PCV13) vaccine  You may need this if you have certain conditions and were not previously vaccinated. Pneumococcal polysaccharide (PPSV23) vaccine  You may need one or two doses if you smoke cigarettes or if you have certain conditions. Meningococcal conjugate (MenACWY) vaccine  You may need this if you have  certain conditions. Hepatitis A vaccine  You may need this if you have certain conditions or if you travel or work in places where you may be exposed to hepatitis A. Hepatitis B vaccine  You may need this if you have certain conditions or if you travel or work in places where you may be exposed to hepatitis B. Haemophilus influenzae type b (Hib) vaccine  You may  need this if you have certain conditions. Human papillomavirus (HPV) vaccine  If recommended by your health care provider, you may need three doses over 6 months. You may receive vaccines as individual doses or as more than one vaccine together in one shot (combination vaccines). Talk with your health care provider about the risks and benefits of combination vaccines. What tests do I need? Blood tests  Lipid and cholesterol levels. These may be checked every 5 years, or more frequently if you are over 11 years old.  Hepatitis C test.  Hepatitis B test. Screening  Lung cancer screening. You may have this screening every year starting at age 39 if you have a 30-pack-year history of smoking and currently smoke or have quit within the past 15 years.  Colorectal cancer screening. All adults should have this screening starting at age 31 and continuing until age 4. Your health care provider may recommend screening at age 59 if you are at increased risk. You will have tests every 1-10 years, depending on your results and the type of screening test.  Diabetes screening. This is done by checking your blood sugar (glucose) after you have not eaten for a while (fasting). You may have this done every 1-3 years.  Mammogram. This may be done every 1-2 years. Talk with your health care provider about when you should start having regular mammograms. This may depend on whether you have a family history of breast cancer.  BRCA-related cancer screening. This may be done if you have a family history of breast, ovarian, tubal, or peritoneal cancers.  Pelvic exam and Pap test. This may be done every 3 years starting at age 29. Starting at age 55, this may be done every 5 years if you have a Pap test in combination with an HPV test. Other tests  Sexually transmitted disease (STD) testing.  Bone density scan. This is done to screen for osteoporosis. You may have this scan if you are at high risk for  osteoporosis. Follow these instructions at home: Eating and drinking  Eat a diet that includes fresh fruits and vegetables, whole grains, lean protein, and low-fat dairy.  Take vitamin and mineral supplements as recommended by your health care provider.  Do not drink alcohol if: ? Your health care provider tells you not to drink. ? You are pregnant, may be pregnant, or are planning to become pregnant.  If you drink alcohol: ? Limit how much you have to 0-1 drink a day. ? Be aware of how much alcohol is in your drink. In the [place] one drink equals one 12 oz bottle of beer (355 mL), one 5 oz glass of wine (148 mL), or one 1 oz glass of hard liquor (44 mL). Lifestyle  Take daily care of your teeth and gums.  Stay active. Exercise for at least 30 minutes on 5 or more days each week.  Do not use any products that contain nicotine or tobacco, such as cigarettes, e-cigarettes, and chewing tobacco. If you need help quitting, ask your health care provider.  If you are sexually active, practice  safe sex. Use a condom or other form of birth control (contraception) in order to prevent pregnancy and STIs (sexually transmitted infections).  If told by your health care provider, take low-dose aspirin daily starting at age 25. What's next?  Visit your health care provider once a year for a well check visit.  Ask your health care provider how often you should have your eyes and teeth checked.  Stay up to date on all vaccines. This information is not intended to replace advice given to you by your health care provider. Make sure you discuss any questions you have with your health care provider. Document Revised: 01/31/2018 Document Reviewed: 01/31/2018 Elsevier Patient Education  2020 [place], MD [facility]

## 2020-01-08 NOTE — Patient Instructions (Addendum)
-Nice seeing you today!!  -Lab work today; will notify you once results are available.  -check BP at home and bring chart in when you return.  -ECHO ordered.  -Schedule follow up in 3 months.   Preventive Care 70-43 Years Old, Female Preventive care refers to visits with your health care provider and lifestyle choices that can promote health and wellness. This includes:  A yearly physical exam. This may also be called an annual well check.  Regular dental visits and eye exams.  Immunizations.  Screening for certain conditions.  Healthy lifestyle choices, such as eating a healthy diet, getting regular exercise, not using drugs or products that contain nicotine and tobacco, and limiting alcohol use. What can I expect for my preventive care visit? Physical exam Your health care provider will check your:  Height and weight. This may be used to calculate body mass index (BMI), which tells if you are at a healthy weight.  Heart rate and blood pressure.  Skin for abnormal spots. Counseling Your health care provider may ask you questions about your:  Alcohol, tobacco, and drug use.  Emotional well-being.  Home and relationship well-being.  Sexual activity.  Eating habits.  Work and work Statistician.  Method of birth control.  Menstrual cycle.  Pregnancy history. What immunizations do I need?  Influenza (flu) vaccine  This is recommended every year. Tetanus, diphtheria, and pertussis (Tdap) vaccine  You may need a Td booster every 10 years. Varicella (chickenpox) vaccine  You may need this if you have not been vaccinated. Zoster (shingles) vaccine  You may need this after age 15. Measles, mumps, and rubella (MMR) vaccine  You may need at least one dose of MMR if you were born in 1957 or later. You may also need a second dose. Pneumococcal conjugate (PCV13) vaccine  You may need this if you have certain conditions and were not previously  vaccinated. Pneumococcal polysaccharide (PPSV23) vaccine  You may need one or two doses if you smoke cigarettes or if you have certain conditions. Meningococcal conjugate (MenACWY) vaccine  You may need this if you have certain conditions. Hepatitis A vaccine  You may need this if you have certain conditions or if you travel or work in places where you may be exposed to hepatitis A. Hepatitis B vaccine  You may need this if you have certain conditions or if you travel or work in places where you may be exposed to hepatitis B. Haemophilus influenzae type b (Hib) vaccine  You may need this if you have certain conditions. Human papillomavirus (HPV) vaccine  If recommended by your health care provider, you may need three doses over 6 months. You may receive vaccines as individual doses or as more than one vaccine together in one shot (combination vaccines). Talk with your health care provider about the risks and benefits of combination vaccines. What tests do I need? Blood tests  Lipid and cholesterol levels. These may be checked every 5 years, or more frequently if you are over 15 years old.  Hepatitis C test.  Hepatitis B test. Screening  Lung cancer screening. You may have this screening every year starting at age 6 if you have a 30-pack-year history of smoking and currently smoke or have quit within the past 15 years.  Colorectal cancer screening. All adults should have this screening starting at age 6 and continuing until age 75. Your health care provider may recommend screening at age 81 if you are at increased risk. You will have  tests every 1-10 years, depending on your results and the type of screening test.  Diabetes screening. This is done by checking your blood sugar (glucose) after you have not eaten for a while (fasting). You may have this done every 1-3 years.  Mammogram. This may be done every 1-2 years. Talk with your health care provider about when you should start  having regular mammograms. This may depend on whether you have a family history of breast cancer.  BRCA-related cancer screening. This may be done if you have a family history of breast, ovarian, tubal, or peritoneal cancers.  Pelvic exam and Pap test. This may be done every 3 years starting at age 7. Starting at age 32, this may be done every 5 years if you have a Pap test in combination with an HPV test. Other tests  Sexually transmitted disease (STD) testing.  Bone density scan. This is done to screen for osteoporosis. You may have this scan if you are at high risk for osteoporosis. Follow these instructions at home: Eating and drinking  Eat a diet that includes fresh fruits and vegetables, whole grains, lean protein, and low-fat dairy.  Take vitamin and mineral supplements as recommended by your health care provider.  Do not drink alcohol if: ? Your health care provider tells you not to drink. ? You are pregnant, may be pregnant, or are planning to become pregnant.  If you drink alcohol: ? Limit how much you have to 0-1 drink a day. ? Be aware of how much alcohol is in your drink. In the [place] one drink equals one 12 oz bottle of beer (355 mL), one 5 oz glass of wine (148 mL), or one 1 oz glass of hard liquor (44 mL). Lifestyle  Take daily care of your teeth and gums.  Stay active. Exercise for at least 30 minutes on 5 or more days each week.  Do not use any products that contain nicotine or tobacco, such as cigarettes, e-cigarettes, and chewing tobacco. If you need help quitting, ask your health care provider.  If you are sexually active, practice safe sex. Use a condom or other form of birth control (contraception) in order to prevent pregnancy and STIs (sexually transmitted infections).  If told by your health care provider, take low-dose aspirin daily starting at age 62. What's next?  Visit your health care provider once a year for a well check visit.  Ask your health  care provider how often you should have your eyes and teeth checked.  Stay up to date on all vaccines. This information is not intended to replace advice given to you by your health care provider. Make sure you discuss any questions you have with your health care provider. Document Revised: 01/31/2018 Document Reviewed: 01/31/2018 Elsevier Patient Education  2020 [employer].

## 2020-01-09 ENCOUNTER — Other Ambulatory Visit (INDEPENDENT_AMBULATORY_CARE_PROVIDER_SITE_OTHER): Payer: BC Managed Care – PPO

## 2020-01-09 DIAGNOSIS — Z0000 Encounter for general adult medical examination without abnormal findings: Secondary | ICD-10-CM | POA: Diagnosis not present

## 2020-01-09 DIAGNOSIS — R739 Hyperglycemia, unspecified: Secondary | ICD-10-CM

## 2020-01-10 LAB — CBC WITH DIFFERENTIAL/PLATELET
Absolute Monocytes: 458 cells/uL (ref 200–950)
Basophils Absolute: 40 cells/uL (ref 0–200)
Basophils Relative: 0.5 %
Eosinophils Absolute: 213 cells/uL (ref 15–500)
Eosinophils Relative: 2.7 %
HCT: 43.8 % (ref 35.0–45.0)
Hemoglobin: 14.6 g/dL (ref 11.7–15.5)
Lymphs Abs: 1746 cells/uL (ref 850–3900)
MCH: 30.8 pg (ref 27.0–33.0)
MCHC: 33.3 g/dL (ref 32.0–36.0)
MCV: 92.4 fL (ref 80.0–100.0)
MPV: 9.5 fL (ref 7.5–12.5)
Monocytes Relative: 5.8 %
Neutro Abs: 5443 cells/uL (ref 1500–7800)
Neutrophils Relative %: 68.9 %
Platelets: 333 10*3/uL (ref 140–400)
RBC: 4.74 10*6/uL (ref 3.80–5.10)
RDW: 11.3 % (ref 11.0–15.0)
Total Lymphocyte: 22.1 %
WBC: 7.9 10*3/uL (ref 3.8–10.8)

## 2020-01-10 LAB — HEMOGLOBIN A1C
Hgb A1c MFr Bld: 5.3 % of total Hgb (ref <5.7)
Mean Plasma Glucose: 105 (calc)
eAG (mmol/L): 5.8 (calc)

## 2020-01-10 LAB — LIPID PANEL
Cholesterol: 247 mg/dL — ABNORMAL HIGH (ref <200)
HDL: 41 mg/dL — ABNORMAL LOW (ref >=50)
LDL Cholesterol (Calc): 164 mg/dL (calc) — ABNORMAL HIGH
Non-HDL Cholesterol (Calc): 206 mg/dL (calc) — ABNORMAL HIGH (ref <130)
Total CHOL/HDL Ratio: 6 (calc) — ABNORMAL HIGH (ref <5.0)
Triglycerides: 254 mg/dL — ABNORMAL HIGH (ref <150)

## 2020-01-10 LAB — COMPREHENSIVE METABOLIC PANEL
AG Ratio: 1.7 (calc) (ref 1.0–2.5)
ALT: 17 U/L (ref 6–29)
AST: 13 U/L (ref 10–30)
Albumin: 4.4 g/dL (ref 3.6–5.1)
Alkaline phosphatase (APISO): 70 U/L (ref 31–125)
BUN: 9 mg/dL (ref 7–25)
CO2: 21 mmol/L (ref 20–32)
Calcium: 9.3 mg/dL (ref 8.6–10.2)
Chloride: 103 mmol/L (ref 98–110)
Creat: 0.83 mg/dL (ref 0.50–1.10)
Globulin: 2.6 g/dL (calc) (ref 1.9–3.7)
Glucose, Bld: 120 mg/dL — ABNORMAL HIGH (ref 65–99)
Potassium: 4.3 mmol/L (ref 3.5–5.3)
Sodium: 136 mmol/L (ref 135–146)
Total Bilirubin: 0.6 mg/dL (ref 0.2–1.2)
Total Protein: 7 g/dL (ref 6.1–8.1)

## 2020-01-10 LAB — TSH: TSH: 3.17 mIU/L

## 2020-01-10 LAB — VITAMIN B12: Vitamin B-12: 533 pg/mL (ref 200–1100)

## 2020-01-10 LAB — VITAMIN D 25 HYDROXY (VIT D DEFICIENCY, FRACTURES): Vit D, 25-Hydroxy: 11 ng/mL — ABNORMAL LOW (ref 30–100)

## 2020-01-13 ENCOUNTER — Encounter: Payer: Self-pay | Admitting: Internal Medicine

## 2020-01-13 ENCOUNTER — Other Ambulatory Visit: Payer: Self-pay | Admitting: Internal Medicine

## 2020-01-13 DIAGNOSIS — E785 Hyperlipidemia, unspecified: Secondary | ICD-10-CM | POA: Insufficient documentation

## 2020-01-13 DIAGNOSIS — E559 Vitamin D deficiency, unspecified: Secondary | ICD-10-CM

## 2020-01-13 MED ORDER — VITAMIN D (ERGOCALCIFEROL) 1.25 MG (50000 UNIT) PO CAPS: 50000.0000 [IU] | ORAL_CAPSULE | ORAL | 0 refills | Status: AC

## 2020-02-03 ENCOUNTER — Ambulatory Visit
Admission: RE | Admit: 2020-02-03 | Discharge: 2020-02-03 | Disposition: A | Discharge status: Home / Self Care | Payer: BC Managed Care – PPO | Source: Ambulatory Visit | Attending: Internal Medicine | Admitting: Internal Medicine

## 2020-02-03 ENCOUNTER — Other Ambulatory Visit: Payer: Self-pay

## 2020-02-03 DIAGNOSIS — Z1231 Encounter for screening mammogram for malignant neoplasm of breast: Secondary | ICD-10-CM | POA: Diagnosis not present

## 2020-02-03 DIAGNOSIS — Z1239 Encounter for other screening for malignant neoplasm of breast: Secondary | ICD-10-CM

## 2020-04-09 ENCOUNTER — Ambulatory Visit: Payer: BC Managed Care – PPO | Admitting: Internal Medicine

## 2020-05-06 ENCOUNTER — Ambulatory Visit: Payer: BC Managed Care – PPO | Admitting: Internal Medicine

## 2020-06-08 ENCOUNTER — Ambulatory Visit: Payer: BC Managed Care – PPO | Admitting: Internal Medicine

## 2020-07-21 ENCOUNTER — Ambulatory Visit: Payer: BC Managed Care – PPO | Admitting: Internal Medicine

## 2020-07-21 ENCOUNTER — Other Ambulatory Visit: Payer: Self-pay

## 2020-07-21 ENCOUNTER — Encounter: Payer: Self-pay | Admitting: Internal Medicine

## 2020-07-21 ENCOUNTER — Other Ambulatory Visit: Payer: Self-pay | Admitting: Internal Medicine

## 2020-07-21 VITALS — BP 150/90 | HR 87 | Temp 98.2°F | Wt 232.4 lb

## 2020-07-21 DIAGNOSIS — F411 Generalized anxiety disorder: Secondary | ICD-10-CM | POA: Diagnosis not present

## 2020-07-21 DIAGNOSIS — E785 Hyperlipidemia, unspecified: Secondary | ICD-10-CM

## 2020-07-21 DIAGNOSIS — I10 Essential (primary) hypertension: Secondary | ICD-10-CM | POA: Insufficient documentation

## 2020-07-21 DIAGNOSIS — E559 Vitamin D deficiency, unspecified: Secondary | ICD-10-CM

## 2020-07-21 MED ORDER — HYDROCHLOROTHIAZIDE 25 MG PO TABS: 25.0000 mg | ORAL_TABLET | Freq: Every day | ORAL | 1 refills | Status: DC

## 2020-07-21 NOTE — Patient Instructions (Signed)
-  Nice seeing you today!!  -Start HCTZ 25 mg daily. Check BP daily and bring measurements into your next visit.  -Schedule follow up in 8 weeks.  -Make sure you schedule therapy sessions.

## 2020-07-21 NOTE — Progress Notes (Signed)
Established Patient Office Visit     This visit occurred during the SARS-CoV-2 public health emergency.  Safety protocols were in place, including screening questions prior to the visit, additional usage of staff PPE, and extensive cleaning of exam room while observing appropriate contact time as indicated for disinfecting solutions.    CC/Reason for Visit: Follow up chronic medical conditions  HPI: Kim SALAMON is a 45 y.o. female who is coming in today for the above mentioned reasons. Past Medical History is significant for: Obesity, HLD and vit d def. She was noted to have elevated BP at previous OV. Elevated home measurements as well. Has been having issues with anxiety: "brain won't turn off at night". Believes she had a panic attack at work. A lot of family and social stressors.   Past Medical/Surgical History: Past Medical History:  Diagnosis Date  . Asthma    allergy induced  . Elevated BP without diagnosis of hypertension   . Morbid obesity (HCC)   . Rosacea     No past surgical history on file.  Social History:  reports that she has never smoked. She has never used smokeless tobacco. She reports current alcohol use of about 5.0 standard drinks of alcohol per week. She reports that she does not use drugs.  Allergies: Allergies  Allergen Reactions  . Flagyl [Metronidazole] Rash    Family History:  Family History  Problem Relation Age of Onset  . Hypertension Mother      Current Outpatient Medications:  .  cetirizine (ZYRTEC ALLERGY) 10 MG tablet, Take 1 tablet (10 mg total) by mouth daily., Disp: 90 tablet, Rfl: 1 .  doxycycline (VIBRA-TABS) 100 MG tablet, Take 100 mg by mouth 2 (two) times daily., Disp: , Rfl:  .  hydrochlorothiazide (HYDRODIURIL) 25 MG tablet, Take 1 tablet (25 mg total) by mouth daily., Disp: 90 tablet, Rfl: 1 .  buPROPion (WELLBUTRIN XL) 150 MG 24 hr tablet, Take 150 mg by mouth daily. (Patient not taking: Reported on  07/21/2020), Disp: , Rfl:  .  naltrexone (DEPADE) 50 MG tablet, Take 25 mg by mouth daily. (Patient not taking: Reported on 07/21/2020), Disp: , Rfl:   Review of Systems:  Constitutional: Denies fever, chills, diaphoresis, appetite change and fatigue.  HEENT: Denies photophobia, eye pain, redness, hearing loss, ear pain, congestion, sore throat, rhinorrhea, sneezing, mouth sores, trouble swallowing, neck pain, neck stiffness and tinnitus.   Respiratory: Denies SOB, DOE, cough, chest tightness,  and wheezing.   Cardiovascular: Denies chest pain, palpitations and leg swelling.  Gastrointestinal: Denies nausea, vomiting, abdominal pain, diarrhea, constipation, blood in stool and abdominal distention.  Genitourinary: Denies dysuria, urgency, frequency, hematuria, flank pain and difficulty urinating.  Endocrine: Denies: hot or cold intolerance, sweats, changes in hair or nails, polyuria, polydipsia. Musculoskeletal: Denies myalgias, back pain, joint swelling, arthralgias and gait problem.  Skin: Denies pallor, rash and wound.  Neurological: Denies dizziness, seizures, syncope, weakness, light-headedness, numbness and headaches.  Hematological: Denies adenopathy. Easy bruising, personal or family bleeding history  Psychiatric/Behavioral: Denies suicidal ideation,confusion, nervousness, and agitation    Physical Exam: Vitals:   07/21/20 1104  BP: (!) 150/90  Pulse: 87  Temp: 98.2 F (36.8 C)  TempSrc: Oral  SpO2: 98%  Weight: 232 lb 6.4 oz (105.4 kg)    Body mass index is 38.67 kg/m.   Constitutional: NAD, calm, comfortable, obese Eyes: PERRL, lids and conjunctivae normal ENMT: Mucous membranes are moist.  Neurologic:  grossly intact and non-focal. Psychiatric: Normal  judgment and insight. Alert and oriented x 3. Normal mood.    Impression and Plan:  Primary hypertension -I will make this diagnosis today.  - Plan: hydrochlorothiazide (HYDRODIURIL) 25 MG tablet -Follow up in 8  weeks. Consider BMET for renal function and electrolytes.  GAD (generalized anxiety disorder) -She will schedule CBT sessions and return visit in 3 months. If still having issues can consider meds such as sertraline.  Hyperlipidemia, unspecified hyperlipidemia type -She will return fasting for repeat lipids, suspect will need statin.  Morbid obesity (HCC) -Discussed healthy lifestyle, including increased physical activity and better food choices to promote weight loss. -She is no longer on contrave.  Vitamin D deficiency -Check labs.   Patient Instructions  -Nice seeing you today!!  -Start HCTZ 25 mg daily. Check BP daily and bring measurements into your next visit.  -Schedule follow up in 8 weeks.  -Make sure you schedule therapy sessions.     Chaya Jan, MD [facility] at [facility]

## 2020-07-22 ENCOUNTER — Other Ambulatory Visit: Payer: Self-pay | Admitting: Internal Medicine

## 2020-07-22 ENCOUNTER — Other Ambulatory Visit (INDEPENDENT_AMBULATORY_CARE_PROVIDER_SITE_OTHER): Payer: BC Managed Care – PPO

## 2020-07-22 DIAGNOSIS — E785 Hyperlipidemia, unspecified: Secondary | ICD-10-CM

## 2020-07-22 DIAGNOSIS — E559 Vitamin D deficiency, unspecified: Secondary | ICD-10-CM

## 2020-07-22 LAB — LIPID PANEL
Cholesterol: 212 mg/dL — ABNORMAL HIGH (ref 0–200)
HDL: 38.3 mg/dL — ABNORMAL LOW (ref >39.00)
NonHDL: 173.95
Total CHOL/HDL Ratio: 6
Triglycerides: 240 mg/dL — ABNORMAL HIGH (ref 0.0–149.0)
VLDL: 48 mg/dL — ABNORMAL HIGH (ref 0.0–40.0)

## 2020-07-22 LAB — VITAMIN D 25 HYDROXY (VIT D DEFICIENCY, FRACTURES): VITD: 14.89 ng/mL — ABNORMAL LOW (ref 30.00–100.00)

## 2020-07-22 LAB — LDL CHOLESTEROL, DIRECT: Direct LDL: 162 mg/dL

## 2020-07-22 MED ORDER — VITAMIN D (ERGOCALCIFEROL) 1.25 MG (50000 UNIT) PO CAPS: 50000.0000 [IU] | ORAL_CAPSULE | ORAL | 0 refills | Status: DC

## 2020-07-22 MED ORDER — ATORVASTATIN CALCIUM 40 MG PO TABS: 40.0000 mg | ORAL_TABLET | Freq: Every day | ORAL | 1 refills | Status: DC

## 2020-09-15 ENCOUNTER — Encounter: Payer: Self-pay | Admitting: Internal Medicine

## 2020-09-15 ENCOUNTER — Other Ambulatory Visit: Payer: Self-pay

## 2020-09-15 ENCOUNTER — Ambulatory Visit: Payer: BC Managed Care – PPO | Admitting: Internal Medicine

## 2020-09-15 VITALS — BP 108/78 | HR 73 | Temp 97.5°F | Wt 227.2 lb

## 2020-09-15 DIAGNOSIS — I10 Essential (primary) hypertension: Secondary | ICD-10-CM | POA: Diagnosis not present

## 2020-09-15 DIAGNOSIS — E559 Vitamin D deficiency, unspecified: Secondary | ICD-10-CM

## 2020-09-15 DIAGNOSIS — R011 Cardiac murmur, unspecified: Secondary | ICD-10-CM

## 2020-09-15 DIAGNOSIS — E785 Hyperlipidemia, unspecified: Secondary | ICD-10-CM | POA: Diagnosis not present

## 2020-09-15 LAB — BASIC METABOLIC PANEL
BUN: 11 mg/dL (ref 6–23)
CO2: 28 mEq/L (ref 19–32)
Calcium: 9.6 mg/dL (ref 8.4–10.5)
Chloride: 100 mEq/L (ref 96–112)
Creatinine, Ser: 0.8 mg/dL (ref 0.40–1.20)
GFR: 89.97 mL/min (ref >60.00)
Glucose, Bld: 118 mg/dL — ABNORMAL HIGH (ref 70–99)
Potassium: 3.7 mEq/L (ref 3.5–5.1)
Sodium: 140 mEq/L (ref 135–145)

## 2020-09-15 LAB — LDL CHOLESTEROL, DIRECT: Direct LDL: 128 mg/dL

## 2020-09-15 LAB — LIPID PANEL
Cholesterol: 169 mg/dL (ref 0–200)
HDL: 38.5 mg/dL — ABNORMAL LOW (ref >39.00)
NonHDL: 130.97
Total CHOL/HDL Ratio: 4
Triglycerides: 215 mg/dL — ABNORMAL HIGH (ref 0.0–149.0)
VLDL: 43 mg/dL — ABNORMAL HIGH (ref 0.0–40.0)

## 2020-09-15 LAB — VITAMIN D 25 HYDROXY (VIT D DEFICIENCY, FRACTURES): VITD: 16.8 ng/mL — ABNORMAL LOW (ref 30.00–100.00)

## 2020-09-15 NOTE — Progress Notes (Signed)
Established Patient Office Visit     This visit occurred during the SARS-CoV-2 public health emergency.  Safety protocols were in place, including screening questions prior to the visit, additional usage of staff PPE, and extensive cleaning of exam room while observing appropriate contact time as indicated for disinfecting solutions.    CC/Reason for Visit: Follow-up blood pressure  HPI: Kim Kirk is a 45 y.o. female who is coming in today for the above mentioned reasons. Past Medical History is significant for: Morbid obesity, hypertension and hyperlipidemia as well as vitamin D deficiency.  At last visit she was started on hydrochlorothiazide 25 mg daily.  She has been tolerating medication well and has no complaints.   Past Medical/Surgical History: Past Medical History:  Diagnosis Date  . Asthma    allergy induced  . Elevated BP without diagnosis of hypertension   . Morbid obesity (HCC)   . Rosacea     No past surgical history on file.  Social History:  reports that she has never smoked. She has never used smokeless tobacco. She reports current alcohol use of about 5.0 standard drinks of alcohol per week. She reports that she does not use drugs.  Allergies: Allergies  Allergen Reactions  . Flagyl [Metronidazole] Rash    Family History:  Family History  Problem Relation Age of Onset  . Hypertension Mother      Current Outpatient Medications:  .  atorvastatin (LIPITOR) 40 MG tablet, Take 1 tablet (40 mg total) by mouth daily., Disp: 90 tablet, Rfl: 1 .  cetirizine (ZYRTEC ALLERGY) 10 MG tablet, Take 1 tablet (10 mg total) by mouth daily., Disp: 90 tablet, Rfl: 1 .  hydrochlorothiazide (HYDRODIURIL) 25 MG tablet, Take 1 tablet (25 mg total) by mouth daily., Disp: 90 tablet, Rfl: 1 .  Vitamin D, Ergocalciferol, (DRISDOL) 1.25 MG (50000 UNIT) CAPS capsule, Take 1 capsule (50,000 Units total) by mouth every 7 (seven) days for 12 doses., Disp: 12 capsule,  Rfl: 0  Review of Systems:  Constitutional: Denies fever, chills, diaphoresis, appetite change and fatigue.  HEENT: Denies photophobia, eye pain, redness, hearing loss, ear pain, congestion, sore throat, rhinorrhea, sneezing, mouth sores, trouble swallowing, neck pain, neck stiffness and tinnitus.   Respiratory: Denies SOB, DOE, cough, chest tightness,  and wheezing.   Cardiovascular: Denies chest pain, palpitations and leg swelling.  Gastrointestinal: Denies nausea, vomiting, abdominal pain, diarrhea, constipation, blood in stool and abdominal distention.  Genitourinary: Denies dysuria, urgency, frequency, hematuria, flank pain and difficulty urinating.  Endocrine: Denies: hot or cold intolerance, sweats, changes in hair or nails, polyuria, polydipsia. Musculoskeletal: Denies myalgias, back pain, joint swelling, arthralgias and gait problem.  Skin: Denies pallor, rash and wound.  Neurological: Denies dizziness, seizures, syncope, weakness, light-headedness, numbness and headaches.  Hematological: Denies adenopathy. Easy bruising, personal or family bleeding history  Psychiatric/Behavioral: Denies suicidal ideation, mood changes, confusion, nervousness, sleep disturbance and agitation    Physical Exam: Vitals:   09/15/20 0738  BP: 108/78  Pulse: 73  Temp: (!) 97.5 F (36.4 C)  TempSrc: Oral  SpO2: 98%  Weight: 227 lb 3.2 oz (103.1 kg)    Body mass index is 37.81 kg/m.   Constitutional: NAD, calm, comfortable Eyes: PERRL, lids and conjunctivae normal ENMT: Mucous membranes are moist.  Respiratory: clear to auscultation bilaterally, no wheezing, no crackles. Normal respiratory effort. No accessory muscle use.  Cardiovascular: Regular rate and rhythm, present systolic murmur, no rubs / gallops.  Neurologic: Grossly intact and nonfocal. Psychiatric:  Normal judgment and insight. Alert and oriented x 3. Normal mood.    Impression and Plan:  Primary hypertension  - Plan: Basic  metabolic panel -On HCTZ 25 mg daily. -Check basic metabolic profile today to follow renal function and electrolytes since on diuretic therapy.  Vitamin D deficiency  -She is currently taking high-dose vitamin D supplementation. Hyperlipidemia, unspecified hyperlipidemia type  - Plan: Lipid panel -She is tolerating atorvastatin 40 mg daily well.  Morbid obesity (HCC) -Discussed healthy lifestyle, including increased physical activity and better food choices to promote weight loss.  Murmur, cardiac  - Plan: ECHOCARDIOGRAM COMPLETE -She has no chest pain or dyspnea with exertion.    Chaya Jan, MD [facility]

## 2020-09-16 ENCOUNTER — Other Ambulatory Visit: Payer: Self-pay | Admitting: Internal Medicine

## 2020-09-16 DIAGNOSIS — E559 Vitamin D deficiency, unspecified: Secondary | ICD-10-CM

## 2020-09-16 MED ORDER — VITAMIN D (ERGOCALCIFEROL) 1.25 MG (50000 UNIT) PO CAPS: 50000.0000 [IU] | ORAL_CAPSULE | ORAL | 0 refills | Status: AC

## 2020-10-13 DIAGNOSIS — H53143 Visual discomfort, bilateral: Secondary | ICD-10-CM | POA: Diagnosis not present

## 2020-12-31 ENCOUNTER — Other Ambulatory Visit: Payer: Self-pay

## 2020-12-31 ENCOUNTER — Ambulatory Visit (HOSPITAL_COMMUNITY): Payer: BC Managed Care – PPO | Attending: Internal Medicine

## 2020-12-31 DIAGNOSIS — R011 Cardiac murmur, unspecified: Secondary | ICD-10-CM

## 2020-12-31 LAB — ECHOCARDIOGRAM COMPLETE
AR max vel: 1.81 cm2
AV Area VTI: 2.34 cm2
AV Area mean vel: 2.21 cm2
AV Mean grad: 10.7 mmHg
AV Peak grad: 24.7 mmHg
Ao pk vel: 2.48 m/s
Area-P 1/2: 3 cm2
P 1/2 time: 334 msec
S' Lateral: 2.4 cm

## 2021-01-03 ENCOUNTER — Telehealth: Payer: Self-pay | Admitting: Internal Medicine

## 2021-01-03 NOTE — Telephone Encounter (Signed)
Echocardiogram results from last week are on MyChart.  Patient is requesting a call back.  She said she is kinda freaking out because she saw the results, so she is needing a call back asap.

## 2021-01-04 ENCOUNTER — Encounter: Payer: Self-pay | Admitting: Internal Medicine

## 2021-01-04 DIAGNOSIS — R011 Cardiac murmur, unspecified: Secondary | ICD-10-CM

## 2021-01-04 NOTE — Telephone Encounter (Signed)
Response sent through Mychart.

## 2021-01-05 ENCOUNTER — Other Ambulatory Visit: Payer: Self-pay

## 2021-01-06 ENCOUNTER — Ambulatory Visit: Payer: BC Managed Care – PPO | Admitting: Internal Medicine

## 2021-01-06 ENCOUNTER — Encounter: Payer: Self-pay | Admitting: Internal Medicine

## 2021-01-06 VITALS — BP 130/90 | HR 89 | Temp 97.6°F | Wt 237.0 lb

## 2021-01-06 DIAGNOSIS — E785 Hyperlipidemia, unspecified: Secondary | ICD-10-CM | POA: Diagnosis not present

## 2021-01-06 DIAGNOSIS — F411 Generalized anxiety disorder: Secondary | ICD-10-CM

## 2021-01-06 DIAGNOSIS — I10 Essential (primary) hypertension: Secondary | ICD-10-CM | POA: Diagnosis not present

## 2021-01-06 DIAGNOSIS — I77810 Thoracic aortic ectasia: Secondary | ICD-10-CM | POA: Diagnosis not present

## 2021-01-06 MED ORDER — SERTRALINE HCL 50 MG PO TABS: 50.0000 mg | ORAL_TABLET | Freq: Every day | ORAL | 1 refills | Status: DC

## 2021-01-06 MED ORDER — AMLODIPINE BESYLATE 5 MG PO TABS: 5.0000 mg | ORAL_TABLET | Freq: Every day | ORAL | 1 refills | Status: DC

## 2021-01-06 NOTE — Patient Instructions (Signed)
-  Nice seeing you today!!  -Start amlodipine 5 mg daily. Continue HCTZ 25 mg daily.  -Start sertraline 50 mg at bedtime.  -Schedule follow up in 6 weeks.  -Please schedule counseling sessions.

## 2021-01-06 NOTE — Progress Notes (Signed)
Established Patient Office Visit     This visit occurred during the SARS-CoV-2 public health emergency.  Safety protocols were in place, including screening questions prior to the visit, additional usage of staff PPE, and extensive cleaning of exam room while observing appropriate contact time as indicated for disinfecting solutions.    CC/Reason for Visit: Follow-up echo, follow-up chronic conditions  HPI: Kim Kirk is a 45 y.o. female who is coming in today for the above mentioned reasons. Past Medical History is significant for: Morbid obesity, hypertension, hyperlipidemia, history of vitamin D deficiency.  She is having issues still controlling her blood pressure.  She states at home it is routinely in the 140/90 range.  In office today blood pressure is 130/90.  She is having significant issues controlling her anxiety.  She has not yet scheduled CBT sessions.  She is requesting medication for anxiety.  She is also requesting follow-up on the results of her echocardiogram that was ordered due to a systolic murmur.  Review of echo report reveals an ejection fraction of 60 to 65%, no wall motion abnormalities, no report of diastolic dysfunction.  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  She is feeling well without issues, notably no shortness of breath/dyspnea on exertion, no chest pain. She has joined [employer] in an effort to lose weight.  Past Medical/Surgical History: Past Medical History:  Diagnosis Date   Asthma    allergy induced   Elevated BP without diagnosis of hypertension    Morbid obesity (HCC)    Rosacea     No past surgical history on file.  Social History:  reports that she has never smoked. She has never used smokeless tobacco. She reports current alcohol use of about 5.0 standard drinks of alcohol per week. She reports that she does not use  drugs.  Allergies: Allergies  Allergen Reactions   Flagyl [Metronidazole] Rash    Family History:  Family History  Problem Relation Age of Onset   Hypertension Mother      Current Outpatient Medications:    amLODipine (NORVASC) 5 MG tablet, Take 1 tablet (5 mg total) by mouth daily., Disp: 90 tablet, Rfl: 1   atorvastatin (LIPITOR) 40 MG tablet, Take 1 tablet (40 mg total) by mouth daily., Disp: 90 tablet, Rfl: 1   cetirizine (ZYRTEC ALLERGY) 10 MG tablet, Take 1 tablet (10 mg total) by mouth daily., Disp: 90 tablet, Rfl: 1   hydrochlorothiazide (HYDRODIURIL) 25 MG tablet, Take 1 tablet (25 mg total) by mouth daily., Disp: 90 tablet, Rfl: 1   sertraline (ZOLOFT) 50 MG tablet, Take 1 tablet (50 mg total) by mouth daily., Disp: 90 tablet, Rfl: 1  Review of Systems:  Constitutional: Denies fever, chills, diaphoresis, appetite change and fatigue.  HEENT: Denies photophobia, eye pain, redness, hearing loss, ear pain, congestion, sore throat, rhinorrhea, sneezing, mouth sores, trouble swallowing, neck pain, neck stiffness and tinnitus.   Respiratory: Denies SOB, DOE, cough, chest tightness,  and wheezing.   Cardiovascular: Denies chest pain, palpitations and leg swelling.  Gastrointestinal: Denies nausea, vomiting, abdominal pain, diarrhea, constipation, blood in stool and abdominal distention.  Genitourinary: Denies dysuria, urgency, frequency, hematuria, flank pain and difficulty urinating.  Endocrine: Denies: hot or cold intolerance, sweats, changes in hair or nails, polyuria, polydipsia. Musculoskeletal: Denies myalgias, back pain, joint swelling, arthralgias and gait problem.  Skin: Denies pallor, rash and wound.  Neurological:  Denies dizziness, seizures, syncope, weakness, light-headedness, numbness and headaches.  Hematological: Denies adenopathy. Easy bruising, personal or family bleeding history  Psychiatric/Behavioral: Denies suicidal ideation,  confusion and  agitation    Physical Exam: Vitals:   01/06/21 1057  BP: 130/90  Pulse: 89  Temp: 97.6 F (36.4 C)  TempSrc: Oral  SpO2: 98%  Weight: 237 lb (107.5 kg)    Body mass index is 39.44 kg/m.   Constitutional: NAD, calm, comfortable, obese Eyes: PERRL, lids and conjunctivae normal ENMT: Mucous membranes are moist.  Respiratory: clear to auscultation bilaterally, no wheezing, no crackles. Normal respiratory effort. No accessory muscle use.  Cardiovascular: Regular rate and rhythm, no murmurs / rubs / gallops. No extremity edema. 2+ pedal pulses. No carotid bruits.  Neurologic: Grossly intact and nonfocal. Psychiatric: Normal judgment and insight. Alert and oriented x 3. Normal mood.    Impression and Plan:  Aortic root dilatation (HCC) -Have encouraged lifestyle changes including weight loss and good blood pressure control. -Per request, referral to cardiology has been placed.  Morbid obesity (HCC) -Discussed healthy lifestyle, including increased physical activity and better food choices to promote weight loss. -She has joined [employer].  Primary hypertension -Still not well controlled.  Continue hydrochlorothiazide 25 mg daily and add amlodipine 5 mg daily.  - Plan: amLODipine (NORVASC) 5 MG tablet  Hyperlipidemia, unspecified hyperlipidemia type -Continue atorvastatin.  GAD (generalized anxiety disorder)  - Plan: sertraline (ZOLOFT) 50 MG tablet -Have strongly reinforced CBT sessions.  Time spent: 32 minutes reviewing chart, interviewing and examining patient and formulating plan of care.   Patient Instructions  -Nice seeing you today!!  -Start amlodipine 5 mg daily. Continue HCTZ 25 mg daily.  -Start sertraline 50 mg at bedtime.  -Schedule follow up in 6 weeks.  -Please schedule counseling sessions.   Chaya Jan, MD [facility] at [facility]

## 2021-01-17 ENCOUNTER — Other Ambulatory Visit: Payer: Self-pay | Admitting: Internal Medicine

## 2021-01-17 DIAGNOSIS — I10 Essential (primary) hypertension: Secondary | ICD-10-CM

## 2021-01-17 DIAGNOSIS — E785 Hyperlipidemia, unspecified: Secondary | ICD-10-CM

## 2021-03-16 ENCOUNTER — Other Ambulatory Visit: Payer: Self-pay

## 2021-03-16 ENCOUNTER — Ambulatory Visit: Payer: BC Managed Care – PPO | Admitting: Interventional Cardiology

## 2021-03-16 ENCOUNTER — Encounter: Payer: Self-pay | Admitting: Interventional Cardiology

## 2021-03-16 VITALS — BP 140/72 | HR 75 | Ht 65.0 in | Wt 231.0 lb

## 2021-03-16 DIAGNOSIS — I351 Nonrheumatic aortic (valve) insufficiency: Secondary | ICD-10-CM

## 2021-03-16 DIAGNOSIS — E782 Mixed hyperlipidemia: Secondary | ICD-10-CM

## 2021-03-16 DIAGNOSIS — I77810 Thoracic aortic ectasia: Secondary | ICD-10-CM | POA: Diagnosis not present

## 2021-03-16 DIAGNOSIS — I10 Essential (primary) hypertension: Secondary | ICD-10-CM | POA: Diagnosis not present

## 2021-03-16 NOTE — Progress Notes (Signed)
Cardiology Office Note   Date:  03/16/2021   ID:  [name], DOB 1976/02/19, MRN [id number]  PCP:  Kim Kirk, Kim Patricia, MD    No chief complaint on file.  AI  Wt Readings from Last 3 Encounters:  03/16/21 231 lb (104.8 kg)  01/06/21 237 lb (107.5 kg)  09/15/20 227 lb 3.2 oz (103.1 kg)       History of Present Illness: Kim Kirk is a 45 y.o. female who is being seen today for the evaluation of murmur at the request of Kim Kirk, Almira Bar*.   Known murmur when she was a teenager but was not thought to be serious.  It was heard again in 2015.  Echo was done: "Left ventricle: The cavity size was normal. Systolic function was    normal. The estimated ejection fraction was in the range of 60%    to 65%. Wall motion was normal; there were no regional wall    motion abnormalities.  - Aortic valve: There was mild regurgitation.  - Mitral valve: There was trivial regurgitation.  - Tricuspid valve: There was trivial regurgitation.  "  Echo heard again by new PMD in 2022. 2022 echo: "Left ventricular ejection fraction, by estimation, is 60 to 65%. The  left ventricle has normal function. The left ventricle has no regional  wall motion abnormalities. Left ventricular diastolic parameters were  normal.   2. Right ventricular systolic function is normal. The right ventricular  size is normal. There is normal pulmonary artery systolic pressure.   3. The mitral valve is normal in structure. No evidence of mitral valve  regurgitation. No evidence of mitral stenosis.   4. Appears tri leaflet small gradient across AV AVA 2.34 cm2. The aortic  valve was not well visualized. There is mild calcification of the aortic  valve. Aortic valve regurgitation is mild. Mild aortic valve sclerosis is  present, with no evidence of  aortic valve stenosis.   5. Aortic dilatation noted. There is moderate dilatation of the ascending  aorta, measuring 40 mm.   6. The  inferior vena cava is normal in size with greater than 50%  respiratory variability, suggesting right atrial pressure of 3 mmHg. "  She has had some issues with anxiety.  Main symptom was shortness of breath.  This improved after starting Zoloft.  Denies : Chest pain. Dizziness. Nitroglycerin use. Orthopnea. Palpitations. Paroxysmal nocturnal dyspnea. Shortness of breath. Syncope.    She is not very physically active.  Starting a continuous glucose monitoring program to help with lifestyle.  She also wants to increase activity.   Past Medical History:  Diagnosis Date   Asthma    allergy induced   Elevated BP without diagnosis of hypertension    Morbid obesity (HCC)    Rosacea     Past Surgical History:  Procedure Laterality Date   no surgicaL HISTORY       Current Outpatient Medications  Medication Sig Dispense Refill   amLODipine (NORVASC) 5 MG tablet Take 1 tablet (5 mg total) by mouth daily. 90 tablet 1   atorvastatin (LIPITOR) 40 MG tablet TAKE 1 TABLET(40 MG) BY MOUTH DAILY 90 tablet 0   cetirizine (ZYRTEC ALLERGY) 10 MG tablet Take 1 tablet (10 mg total) by mouth daily. 90 tablet 1   hydrochlorothiazide (HYDRODIURIL) 25 MG tablet TAKE 1 TABLET(25 MG) BY MOUTH DAILY 90 tablet 0   sertraline (ZOLOFT) 50 MG tablet Take 1 tablet (50 mg total) by mouth daily.  90 tablet 1   No current facility-administered medications for this visit.    Allergies:   Flagyl [metronidazole]    Social History:  The patient  reports that she has never smoked. She has never used smokeless tobacco. She reports current alcohol use of about 5.0 standard drinks per week. She reports that she does not use drugs.   Family History:  The patient's family history includes Diabetes in her maternal grandfather and maternal uncle; Hypertension in her mother; Skin cancer in her father.    ROS:  Please see the history of present illness.   Otherwise, review of systems are positive for leg swelling after a  long plane trip, resolved after 1 day.   All other systems are reviewed and negative.    PHYSICAL EXAM: VS:  BP 140/72   Pulse 75   Ht 5\' 5"  (1.651 m)   Wt 231 lb (104.8 kg)   SpO2 98%   BMI 38.44 kg/m  , BMI Body mass index is 38.44 kg/m. GEN: Well nourished, well developed, in no acute distress HEENT: normal Neck: no JVD, carotid bruits, or masses Cardiac: RRR; 2 out of 6 early systolic murmur, 2 out of 6 holodiastolic murmur, no rubs, or gallops,no edema  Respiratory:  clear to auscultation bilaterally, normal work of breathing GI: soft, nontender, nondistended, + BS MS: no deformity or atrophy Skin: warm and dry, no rash Neuro:  Strength and sensation are intact Psych: euthymic mood, full affect   EKG:   The ekg ordered today demonstrates normal ECG   Recent Labs: 09/15/2020: BUN 11; Creatinine, Ser 0.80; Potassium 3.7; Sodium 140   Lipid Panel    Component Value Date/Time   CHOL 169 09/15/2020 0753   TRIG 215.0 (H) 09/15/2020 0753   HDL 38.50 (L) 09/15/2020 0753   CHOLHDL 4 09/15/2020 0753   VLDL 43.0 (H) 09/15/2020 0753   LDLCALC 164 (H) 01/09/2020 0758   LDLDIRECT 128.0 09/15/2020 0753     Other studies Reviewed: Additional studies/ records that were reviewed today with results demonstrating: labs revoiewed; TC 169 on lipitor.   ASSESSMENT AND PLAN:  Aortic insufficiency : mild AI by echo.  No CHF symptoms. HTN: Average blood pressure has been in the 120s over 80s range.  Continue current medications. Dilated aortic root: check CTA in 09/2021 to see if there is any change.  No family history of aneurysms.  Continue to monitor blood pressure. Hyperlipidemia: Needs LFTs at next check with her PMD.  No aortic atherosclerosis on CT from 2019.  We will see what the upcoming CT shows. Spoke about a whole food, plant-based diet.  We spoke about the "talk test" to get some moderate exercise incorporated into her lifestyle.   Current medicines are reviewed at  length with the patient today.  The patient concerns regarding her medicines were addressed.  The following changes have been made:  No change  Labs/ tests ordered today include:  No orders of the defined types were placed in this encounter.   Recommend 150 minutes/week of aerobic exercise Low fat, low carb, high fiber diet recommended  Disposition:   FU in 1 year   Signed, 2020, MD  03/16/2021 11:16 AM    [facility] [address] [place], [place], [place]  [place] Phone: [phone]; Fax: [phone]

## 2021-03-16 NOTE — Patient Instructions (Signed)
Medication Instructions:  Your physician recommends that you continue on your current medications as directed. Please refer to the Current Medication list given to you today.  *If you need a refill on your cardiac medications before your next appointment, please call your pharmacy*   Lab Work: none If you have labs (blood work) drawn today and your tests are completely normal, you will receive your results only by: MyChart Message (if you have MyChart) OR A paper copy in the mail If you have any lab test that is abnormal or we need to change your treatment, we will call you to review the results.   Testing/Procedures: Non-Cardiac CT Angiography (CTA), is a special type of CT scan that uses a computer to produce multi-dimensional views of major blood vessels throughout the body. In CT angiography, a contrast material is injected through an IV to help visualize the blood vessels --To be done in April 2023   Follow-Up: At [facility], you and your health needs are our priority.  As part of our continuing mission to provide you with exceptional heart care, we have created designated Provider Care Teams.  These Care Teams include your primary Cardiologist (physician) and Advanced Practice Providers (APPs -  Physician Assistants and Nurse Practitioners) who all work together to provide you with the care you need, when you need it.  We recommend signing up for the patient portal called "MyChart".  Sign up information is provided on this After Visit Summary.  MyChart is used to connect with patients for Virtual Visits (Telemedicine).  Patients are able to view lab/test results, encounter notes, upcoming appointments, etc.  Non-urgent messages can be sent to your provider as well.   To learn more about what you can do with MyChart, go to [email or web address].    Your next appointment:   12 month(s)  The format for your next appointment:   In Person  Provider:   You may see Lance Muss, MD or one of the following Advanced Practice Providers on your designated Care Team:   Ronie Spies, PA-C Jacolyn Reedy, PA-C   Other Instructions

## 2021-03-22 ENCOUNTER — Other Ambulatory Visit: Payer: Self-pay | Admitting: Internal Medicine

## 2021-03-22 DIAGNOSIS — Z1231 Encounter for screening mammogram for malignant neoplasm of breast: Secondary | ICD-10-CM

## 2021-03-31 ENCOUNTER — Other Ambulatory Visit: Payer: Self-pay | Admitting: Internal Medicine

## 2021-03-31 DIAGNOSIS — I10 Essential (primary) hypertension: Secondary | ICD-10-CM

## 2021-04-19 ENCOUNTER — Ambulatory Visit
Admission: RE | Admit: 2021-04-19 | Discharge: 2021-04-19 | Disposition: A | Discharge status: Home / Self Care | Payer: BC Managed Care – PPO | Source: Ambulatory Visit | Attending: Internal Medicine | Admitting: Internal Medicine

## 2021-04-19 ENCOUNTER — Other Ambulatory Visit: Payer: Self-pay

## 2021-04-19 DIAGNOSIS — Z1231 Encounter for screening mammogram for malignant neoplasm of breast: Secondary | ICD-10-CM | POA: Diagnosis not present

## 2021-04-25 ENCOUNTER — Other Ambulatory Visit: Payer: Self-pay | Admitting: Internal Medicine

## 2021-04-25 DIAGNOSIS — E785 Hyperlipidemia, unspecified: Secondary | ICD-10-CM

## 2021-07-11 ENCOUNTER — Other Ambulatory Visit: Payer: Self-pay | Admitting: Internal Medicine

## 2021-07-11 DIAGNOSIS — F411 Generalized anxiety disorder: Secondary | ICD-10-CM

## 2021-07-11 DIAGNOSIS — I10 Essential (primary) hypertension: Secondary | ICD-10-CM

## 2021-08-05 DIAGNOSIS — D2261 Melanocytic nevi of right upper limb, including shoulder: Secondary | ICD-10-CM | POA: Diagnosis not present

## 2021-08-05 DIAGNOSIS — L718 Other rosacea: Secondary | ICD-10-CM | POA: Diagnosis not present

## 2021-08-05 DIAGNOSIS — D1801 Hemangioma of skin and subcutaneous tissue: Secondary | ICD-10-CM | POA: Diagnosis not present

## 2021-08-05 DIAGNOSIS — D225 Melanocytic nevi of trunk: Secondary | ICD-10-CM | POA: Diagnosis not present

## 2021-09-16 ENCOUNTER — Other Ambulatory Visit: Payer: BC Managed Care – PPO

## 2021-09-23 ENCOUNTER — Ambulatory Visit (INDEPENDENT_AMBULATORY_CARE_PROVIDER_SITE_OTHER)
Admission: RE | Admit: 2021-09-23 | Discharge: 2021-09-23 | Disposition: A | Discharge status: Home / Self Care | Payer: BC Managed Care – PPO | Source: Ambulatory Visit | Attending: Interventional Cardiology | Admitting: Interventional Cardiology

## 2021-09-23 DIAGNOSIS — I7121 Aneurysm of the ascending aorta, without rupture: Secondary | ICD-10-CM | POA: Diagnosis not present

## 2021-09-23 DIAGNOSIS — I77810 Thoracic aortic ectasia: Secondary | ICD-10-CM | POA: Diagnosis not present

## 2021-09-23 MED ORDER — IOHEXOL 350 MG/ML SOLN
100.0000 mL | Freq: Once | INTRAVENOUS | Status: AC | PRN
  Administered 2021-09-23: 100 mL via INTRAVENOUS

## 2021-10-03 ENCOUNTER — Other Ambulatory Visit: Payer: Self-pay | Admitting: *Deleted

## 2021-10-03 DIAGNOSIS — I77810 Thoracic aortic ectasia: Secondary | ICD-10-CM

## 2021-10-16 ENCOUNTER — Other Ambulatory Visit: Payer: Self-pay | Admitting: Internal Medicine

## 2021-10-16 DIAGNOSIS — I10 Essential (primary) hypertension: Secondary | ICD-10-CM

## 2021-12-05 ENCOUNTER — Ambulatory Visit: Payer: BC Managed Care – PPO | Admitting: Internal Medicine

## 2022-01-14 ENCOUNTER — Other Ambulatory Visit: Payer: Self-pay | Admitting: Internal Medicine

## 2022-01-14 DIAGNOSIS — E785 Hyperlipidemia, unspecified: Secondary | ICD-10-CM

## 2022-01-14 DIAGNOSIS — I10 Essential (primary) hypertension: Secondary | ICD-10-CM

## 2022-01-14 DIAGNOSIS — F411 Generalized anxiety disorder: Secondary | ICD-10-CM

## 2022-02-02 ENCOUNTER — Other Ambulatory Visit: Payer: Self-pay | Admitting: Internal Medicine

## 2022-02-02 DIAGNOSIS — I10 Essential (primary) hypertension: Secondary | ICD-10-CM

## 2022-06-14 ENCOUNTER — Other Ambulatory Visit: Payer: Self-pay | Admitting: Internal Medicine

## 2022-06-14 DIAGNOSIS — Z1231 Encounter for screening mammogram for malignant neoplasm of breast: Secondary | ICD-10-CM

## 2022-08-04 ENCOUNTER — Ambulatory Visit
Admission: RE | Admit: 2022-08-04 | Discharge: 2022-08-04 | Disposition: A | Discharge status: Home / Self Care | Payer: BC Managed Care – PPO | Source: Ambulatory Visit | Attending: Internal Medicine | Admitting: Internal Medicine

## 2022-08-04 DIAGNOSIS — Z1231 Encounter for screening mammogram for malignant neoplasm of breast: Secondary | ICD-10-CM | POA: Diagnosis not present

## 2022-09-23 ENCOUNTER — Ambulatory Visit: Payer: Self-pay

## 2022-09-23 DIAGNOSIS — R300 Dysuria: Secondary | ICD-10-CM | POA: Diagnosis not present

## 2022-09-25 ENCOUNTER — Ambulatory Visit (HOSPITAL_COMMUNITY)
Admission: RE | Admit: 2022-09-25 | Discharge: 2022-09-25 | Disposition: A | Discharge status: Home / Self Care | Payer: BC Managed Care – PPO | Source: Ambulatory Visit | Attending: Interventional Cardiology | Admitting: Interventional Cardiology

## 2022-09-25 ENCOUNTER — Ambulatory Visit (INDEPENDENT_AMBULATORY_CARE_PROVIDER_SITE_OTHER): Payer: BC Managed Care – PPO | Admitting: Internal Medicine

## 2022-09-25 ENCOUNTER — Other Ambulatory Visit: Payer: Self-pay | Admitting: Internal Medicine

## 2022-09-25 ENCOUNTER — Encounter: Payer: Self-pay | Admitting: Internal Medicine

## 2022-09-25 VITALS — BP 120/88 | HR 85 | Temp 97.5°F | Ht 65.0 in | Wt 191.4 lb

## 2022-09-25 DIAGNOSIS — J9811 Atelectasis: Secondary | ICD-10-CM | POA: Diagnosis not present

## 2022-09-25 DIAGNOSIS — I77810 Thoracic aortic ectasia: Secondary | ICD-10-CM | POA: Diagnosis not present

## 2022-09-25 DIAGNOSIS — Z23 Encounter for immunization: Secondary | ICD-10-CM

## 2022-09-25 DIAGNOSIS — Z0000 Encounter for general adult medical examination without abnormal findings: Secondary | ICD-10-CM | POA: Diagnosis not present

## 2022-09-25 DIAGNOSIS — I10 Essential (primary) hypertension: Secondary | ICD-10-CM

## 2022-09-25 DIAGNOSIS — E785 Hyperlipidemia, unspecified: Secondary | ICD-10-CM

## 2022-09-25 DIAGNOSIS — L719 Rosacea, unspecified: Secondary | ICD-10-CM

## 2022-09-25 DIAGNOSIS — F411 Generalized anxiety disorder: Secondary | ICD-10-CM

## 2022-09-25 DIAGNOSIS — E559 Vitamin D deficiency, unspecified: Secondary | ICD-10-CM

## 2022-09-25 DIAGNOSIS — E782 Mixed hyperlipidemia: Secondary | ICD-10-CM

## 2022-09-25 LAB — COMPREHENSIVE METABOLIC PANEL
ALT: 14 U/L (ref 0–35)
AST: 13 U/L (ref 0–37)
Albumin: 4.4 g/dL (ref 3.5–5.2)
Alkaline Phosphatase: 67 U/L (ref 39–117)
BUN: 8 mg/dL (ref 6–23)
CO2: 25 mEq/L (ref 19–32)
Calcium: 9.4 mg/dL (ref 8.4–10.5)
Chloride: 104 mEq/L (ref 96–112)
Creatinine, Ser: 0.85 mg/dL (ref 0.40–1.20)
GFR: 82.48 mL/min (ref >60.00)
Glucose, Bld: 117 mg/dL — ABNORMAL HIGH (ref 70–99)
Potassium: 4 mEq/L (ref 3.5–5.1)
Sodium: 137 mEq/L (ref 135–145)
Total Bilirubin: 0.4 mg/dL (ref 0.2–1.2)
Total Protein: 7.1 g/dL (ref 6.0–8.3)

## 2022-09-25 LAB — CBC WITH DIFFERENTIAL/PLATELET
Basophils Absolute: 0.1 10*3/uL (ref 0.0–0.1)
Basophils Relative: 0.9 % (ref 0.0–3.0)
Eosinophils Absolute: 0.2 10*3/uL (ref 0.0–0.7)
Eosinophils Relative: 3.1 % (ref 0.0–5.0)
HCT: 43 % (ref 36.0–46.0)
Hemoglobin: 14.9 g/dL (ref 12.0–15.0)
Lymphocytes Relative: 24.7 % (ref 12.0–46.0)
Lymphs Abs: 1.5 10*3/uL (ref 0.7–4.0)
MCHC: 34.7 g/dL (ref 30.0–36.0)
MCV: 93.5 fl (ref 78.0–100.0)
Monocytes Absolute: 0.4 10*3/uL (ref 0.1–1.0)
Monocytes Relative: 6.5 % (ref 3.0–12.0)
Neutro Abs: 3.9 10*3/uL (ref 1.4–7.7)
Neutrophils Relative %: 64.8 % (ref 43.0–77.0)
Platelets: 316 10*3/uL (ref 150.0–400.0)
RBC: 4.59 Mil/uL (ref 3.87–5.11)
RDW: 12.4 % (ref 11.5–15.5)
WBC: 6 10*3/uL (ref 4.0–10.5)

## 2022-09-25 LAB — LDL CHOLESTEROL, DIRECT: Direct LDL: 152 mg/dL

## 2022-09-25 LAB — VITAMIN B12: Vitamin B-12: 331 pg/mL (ref 211–911)

## 2022-09-25 LAB — LIPID PANEL
Cholesterol: 204 mg/dL — ABNORMAL HIGH (ref 0–200)
HDL: 46.4 mg/dL (ref >39.00)
NonHDL: 157.8
Total CHOL/HDL Ratio: 4
Triglycerides: 203 mg/dL — ABNORMAL HIGH (ref 0.0–149.0)
VLDL: 40.6 mg/dL — ABNORMAL HIGH (ref 0.0–40.0)

## 2022-09-25 LAB — VITAMIN D 25 HYDROXY (VIT D DEFICIENCY, FRACTURES): VITD: 13.87 ng/mL — ABNORMAL LOW (ref 30.00–100.00)

## 2022-09-25 LAB — HEMOGLOBIN A1C: Hgb A1c MFr Bld: 4.8 % (ref 4.6–6.5)

## 2022-09-25 LAB — TSH: TSH: 1.54 u[IU]/mL (ref 0.35–5.50)

## 2022-09-25 MED ORDER — IOHEXOL 350 MG/ML SOLN
75.0000 mL | Freq: Once | INTRAVENOUS | Status: AC | PRN
  Administered 2022-09-25: 75 mL via INTRAVENOUS

## 2022-09-25 MED ORDER — SERTRALINE HCL 50 MG PO TABS: ORAL_TABLET | ORAL | 1 refills | Status: DC

## 2022-09-25 MED ORDER — AMLODIPINE BESYLATE 5 MG PO TABS: ORAL_TABLET | ORAL | 1 refills | Status: DC

## 2022-09-25 MED ORDER — ATORVASTATIN CALCIUM 40 MG PO TABS: ORAL_TABLET | ORAL | 1 refills | Status: DC

## 2022-09-25 MED ORDER — CETIRIZINE HCL 10 MG PO TABS: 10.0000 mg | ORAL_TABLET | Freq: Every day | ORAL | 1 refills | Status: DC

## 2022-09-25 MED ORDER — VITAMIN D (ERGOCALCIFEROL) 1.25 MG (50000 UNIT) PO CAPS: 50000.0000 [IU] | ORAL_CAPSULE | ORAL | 0 refills | Status: AC

## 2022-09-25 NOTE — Progress Notes (Signed)
Established Patient Office Visit     CC/Reason for Visit: Annual preventive exam  HPI: Kim Kirk is a 47 y.o. female who is coming in today for the above mentioned reasons. Past Medical History is significant for: Hypertension, hyperlipidemia, generalized anxiety disorder, vitamin D deficiency.  She started taking a zepbound last August via weight watchers.  She has lost 40 pounds since.  She is feeling well.  She is overdue for GYN.  She is overdue for Tdap.  She has routine eye and dental care.   Past Medical/Surgical History: Past Medical History:  Diagnosis Date   Asthma    allergy induced   Elevated BP without diagnosis of hypertension    Morbid obesity    Rosacea     Past Surgical History:  Procedure Laterality Date   no surgicaL HISTORY      Social History:  reports that she has never smoked. She has never used smokeless tobacco. She reports current alcohol use of about 5.0 standard drinks of alcohol per week. She reports that she does not use drugs.  Allergies: Allergies  Allergen Reactions   Flagyl [Metronidazole] Rash    Family History:  Family History  Problem Relation Age of Onset   Hypertension Mother    Skin cancer Father    Diabetes Maternal Uncle    Diabetes Maternal Grandfather      Current Outpatient Medications:    tirzepatide (ZEPBOUND) 15 MG/0.5ML Pen, Inject 15 mg into the skin once a week., Disp: , Rfl:    amLODipine (NORVASC) 5 MG tablet, TAKE 1 TABLET(5 MG) BY MOUTH DAILY, Disp: 90 tablet, Rfl: 1   atorvastatin (LIPITOR) 40 MG tablet, TAKE 1 TABLET(40 MG) BY MOUTH DAILY, Disp: 90 tablet, Rfl: 1   cetirizine (ZYRTEC ALLERGY) 10 MG tablet, Take 1 tablet (10 mg total) by mouth daily., Disp: 90 tablet, Rfl: 1   sertraline (ZOLOFT) 50 MG tablet, TAKE 1 TABLET(50 MG) BY MOUTH DAILY, Disp: 90 tablet, Rfl: 1  Review of Systems:  Negative unless indicated in HPI.   Physical Exam: Vitals:   09/25/22 0705  BP: 120/88  Pulse:  85  Temp: (!) 97.5 F (36.4 C)  TempSrc: Oral  SpO2: 99%  Weight: 191 lb 6.4 oz (86.8 kg)  Height:  (1.651 m)    Body mass index is 31.85 kg/m.   Physical Exam Vitals reviewed.  Constitutional:      General: She is not in acute distress.    Appearance: Normal appearance. She is not ill-appearing, toxic-appearing or diaphoretic.  HENT:     Head: Normocephalic.     Right Ear: Tympanic membrane, ear canal and external ear normal. There is no impacted cerumen.     Left Ear: Tympanic membrane, ear canal and external ear normal. There is no impacted cerumen.     Nose: Nose normal.     Mouth/Throat:     Mouth: Mucous membranes are moist.     Pharynx: Oropharynx is clear. No oropharyngeal exudate or posterior oropharyngeal erythema.  Eyes:     General: No scleral icterus.       Right eye: No discharge.        Left eye: No discharge.     Conjunctiva/sclera: Conjunctivae normal.     Pupils: Pupils are equal, round, and reactive to light.  Neck:     Vascular: No carotid bruit.  Cardiovascular:     Rate and Rhythm: Normal rate and regular rhythm.     Pulses:  Normal pulses.     Heart sounds: Normal heart sounds.  Pulmonary:     Effort: Pulmonary effort is normal. No respiratory distress.     Breath sounds: Normal breath sounds.  Abdominal:     General: Abdomen is flat. Bowel sounds are normal.     Palpations: Abdomen is soft.  Musculoskeletal:        General: Normal range of motion.     Cervical back: Normal range of motion.  Skin:    General: Skin is warm and dry.  Neurological:     General: No focal deficit present.     Mental Status: She is alert and oriented to person, place, and time. Mental status is at baseline.  Psychiatric:        Mood and Affect: Mood normal.        Behavior: Behavior normal.        Thought Content: Thought content normal.        Judgment: Judgment normal.     Flowsheet Row Office Visit from 09/25/2022 in [facility]  [place]  PHQ-9 Total Score 3        Impression and Plan:  Primary hypertension - Plan: CBC with Differential/Platelet, Comprehensive metabolic panel, TSH, amLODipine (NORVASC) 5 MG tablet  Hyperlipidemia, unspecified hyperlipidemia type - Plan: Lipid panel, atorvastatin (LIPITOR) 40 MG tablet  Vitamin D deficiency - Plan: Vitamin B12, Vitamin D, 25-hydroxy  Rosacea - Plan: cetirizine (ZYRTEC ALLERGY) 10 MG tablet  GAD (generalized anxiety disorder) - Plan: sertraline (ZOLOFT) 50 MG tablet  Morbid obesity - Plan: Hemoglobin A1c  -Recommend routine eye and dental care. -Healthy lifestyle discussed in detail. -Labs to be updated today. -Prostate cancer screening: N/A Health Maintenance  Topic Date Due   HIV Screening  Never done   Hepatitis C Screening: USPSTF Recommendation to screen - Ages 5-79 yo.  Never done   DTaP/Tdap/Td vaccine (2 - Td or Tdap) 06/06/2015   Pap Smear  09/25/2022*   COVID-19 Vaccine (4 - 2023-24 season) 10/11/2022*   Flu Shot  01/04/2023   Colon Cancer Screening  09/25/2030   HPV Vaccine  Aged Out  *Topic was postponed. The date shown is not the original due date.   -Tdap administered today. -GYN referral placed.      Chaya Jan, MD [facility]

## 2022-09-28 ENCOUNTER — Other Ambulatory Visit: Payer: Self-pay | Admitting: *Deleted

## 2022-09-28 DIAGNOSIS — I77810 Thoracic aortic ectasia: Secondary | ICD-10-CM

## 2022-10-02 ENCOUNTER — Inpatient Hospital Stay: Admission: RE | Admit: 2022-10-02 | Payer: BC Managed Care – PPO | Source: Ambulatory Visit

## 2022-10-18 DIAGNOSIS — Z01419 Encounter for gynecological examination (general) (routine) without abnormal findings: Secondary | ICD-10-CM | POA: Diagnosis not present

## 2022-10-18 DIAGNOSIS — Z124 Encounter for screening for malignant neoplasm of cervix: Secondary | ICD-10-CM | POA: Diagnosis not present

## 2022-10-18 DIAGNOSIS — Z1151 Encounter for screening for human papillomavirus (HPV): Secondary | ICD-10-CM | POA: Diagnosis not present

## 2022-10-18 DIAGNOSIS — Z6831 Body mass index (BMI) 31.0-31.9, adult: Secondary | ICD-10-CM | POA: Diagnosis not present

## 2022-11-13 ENCOUNTER — Other Ambulatory Visit: Payer: Self-pay | Admitting: Internal Medicine

## 2022-11-13 ENCOUNTER — Encounter: Payer: Self-pay | Admitting: Internal Medicine

## 2022-11-13 MED ORDER — ZEPBOUND 15 MG/0.5ML ~~LOC~~ SOAJ: 15.0000 mg | SUBCUTANEOUS | 0 refills | Status: DC

## 2023-02-07 ENCOUNTER — Other Ambulatory Visit: Payer: Self-pay | Admitting: Internal Medicine

## 2023-03-27 DIAGNOSIS — L718 Other rosacea: Secondary | ICD-10-CM | POA: Diagnosis not present

## 2023-04-26 DIAGNOSIS — R52 Pain, unspecified: Secondary | ICD-10-CM | POA: Diagnosis not present

## 2023-04-26 DIAGNOSIS — R5383 Other fatigue: Secondary | ICD-10-CM | POA: Diagnosis not present

## 2023-04-26 DIAGNOSIS — R0981 Nasal congestion: Secondary | ICD-10-CM | POA: Diagnosis not present

## 2023-06-16 ENCOUNTER — Other Ambulatory Visit: Payer: Self-pay | Admitting: Internal Medicine

## 2023-06-16 DIAGNOSIS — F411 Generalized anxiety disorder: Secondary | ICD-10-CM

## 2023-06-21 ENCOUNTER — Ambulatory Visit: Payer: BC Managed Care – PPO | Admitting: Internal Medicine

## 2023-06-21 ENCOUNTER — Encounter: Payer: Self-pay | Admitting: Internal Medicine

## 2023-06-21 DIAGNOSIS — E559 Vitamin D deficiency, unspecified: Secondary | ICD-10-CM | POA: Diagnosis not present

## 2023-06-21 DIAGNOSIS — E782 Mixed hyperlipidemia: Secondary | ICD-10-CM | POA: Diagnosis not present

## 2023-06-21 DIAGNOSIS — I10 Essential (primary) hypertension: Secondary | ICD-10-CM | POA: Diagnosis not present

## 2023-06-21 LAB — LIPID PANEL
Cholesterol: 158 mg/dL (ref 0–200)
HDL: 44.9 mg/dL (ref >39.00)
LDL Cholesterol: 98 mg/dL (ref 0–99)
NonHDL: 112.98
Total CHOL/HDL Ratio: 4
Triglycerides: 76 mg/dL (ref 0.0–149.0)
VLDL: 15.2 mg/dL (ref 0.0–40.0)

## 2023-06-21 LAB — VITAMIN B12: Vitamin B-12: 399 pg/mL (ref 211–911)

## 2023-06-21 LAB — VITAMIN D 25 HYDROXY (VIT D DEFICIENCY, FRACTURES): VITD: 10.88 ng/mL — ABNORMAL LOW (ref 30.00–100.00)

## 2023-06-21 NOTE — Progress Notes (Signed)
Established Patient Office Visit     CC/Reason for Visit: Follow-up chronic conditions  HPI: Kim Kirk is a 48 y.o. female who is coming in today for the above mentioned reasons. Past Medical History is significant for: Morbid obesity, vitamin D deficiency, hypertension, hyperlipidemia.  She has been feeling a little more fatigued, has noted thinning of her hair but otherwise is doing well.  Is requesting Zepbound refills.   Past Medical/Surgical History: Past Medical History:  Diagnosis Date   Asthma    allergy induced   Elevated BP without diagnosis of hypertension    Morbid obesity (HCC)    Rosacea     Past Surgical History:  Procedure Laterality Date   no surgicaL HISTORY      Social History:  reports that she has never smoked. She has never used smokeless tobacco. She reports current alcohol use of about 5.0 standard drinks of alcohol per week. She reports that she does not use drugs.  Allergies: Allergies  Allergen Reactions   Flagyl [Metronidazole] Rash    Family History:  Family History  Problem Relation Age of Onset   Hypertension Mother    Skin cancer Father    Diabetes Maternal Uncle    Diabetes Maternal Grandfather      Current Outpatient Medications:    amLODipine (NORVASC) 5 MG tablet, TAKE 1 TABLET(5 MG) BY MOUTH DAILY, Disp: 90 tablet, Rfl: 1   atorvastatin (LIPITOR) 40 MG tablet, TAKE 1 TABLET(40 MG) BY MOUTH DAILY, Disp: 90 tablet, Rfl: 1   cetirizine (ZYRTEC ALLERGY) 10 MG tablet, Take 1 tablet (10 mg total) by mouth daily., Disp: 90 tablet, Rfl: 1   sertraline (ZOLOFT) 50 MG tablet, TAKE 1 TABLET(50 MG) BY MOUTH DAILY, Disp: 90 tablet, Rfl: 1   ZEPBOUND 15 MG/0.5ML Pen, ADMINISTER 15 MG UNDER THE SKIN 1 TIME A WEEK., Disp: 6 mL, Rfl: 0  Review of Systems:  Negative unless indicated in HPI.   Physical Exam: Vitals:   06/21/23 0755  BP: 110/80  Pulse: 88  Temp: 97.7 F (36.5 C)  TempSrc: Oral  SpO2: 98%  Weight: 183  lb 1.6 oz (83.1 kg)  PF: (!) 6 L/min    Body mass index is 30.47 kg/m.   Physical Exam Vitals reviewed.  Constitutional:      Appearance: Normal appearance.  HENT:     Head: Normocephalic and atraumatic.  Eyes:     Conjunctiva/sclera: Conjunctivae normal.  Skin:    General: Skin is warm and dry.  Neurological:     General: No focal deficit present.     Mental Status: She is alert and oriented to person, place, and time.  Psychiatric:        Mood and Affect: Mood normal.        Behavior: Behavior normal.        Thought Content: Thought content normal.        Judgment: Judgment normal.      Impression and Plan:  Morbid obesity (HCC) -     Vitamin B12; Future  Vitamin D deficiency -     VITAMIN D 25 Hydroxy (Vit-D Deficiency, Fractures); Future  Primary hypertension  Mixed hyperlipidemia -     Lipid panel; Future   -She has continued to do well on Zepbound 15 mg, has continued to lose weight, needs refills today. -She has been feeling very tired, fatigue, hair loss.  Check vitamin D and B12 levels today.  Deficiencies are common with weight loss.  Have also asked that she discuss this with GYN as may be part of perimenopausal symptoms. -Blood pressure is well-controlled on current. -Recheck lipids today.  Time spent:30 minutes reviewing chart, interviewing and examining patient and formulating plan of care.     Chaya Jan, MD  Primary Care at [facility]

## 2023-06-25 ENCOUNTER — Encounter: Payer: Self-pay | Admitting: Internal Medicine

## 2023-06-25 ENCOUNTER — Other Ambulatory Visit: Payer: Self-pay | Admitting: Internal Medicine

## 2023-06-25 DIAGNOSIS — E559 Vitamin D deficiency, unspecified: Secondary | ICD-10-CM

## 2023-06-25 MED ORDER — ZEPBOUND 15 MG/0.5ML ~~LOC~~ SOAJ: 15.0000 mg | SUBCUTANEOUS | 2 refills | Status: DC

## 2023-06-25 MED ORDER — VITAMIN D (ERGOCALCIFEROL) 1.25 MG (50000 UNIT) PO CAPS: 50000.0000 [IU] | ORAL_CAPSULE | ORAL | 0 refills | Status: AC

## 2023-08-14 DIAGNOSIS — M542 Cervicalgia: Secondary | ICD-10-CM | POA: Diagnosis not present

## 2023-08-30 ENCOUNTER — Ambulatory Visit: Admitting: Internal Medicine

## 2023-08-30 ENCOUNTER — Encounter: Payer: Self-pay | Admitting: Internal Medicine

## 2023-08-30 VITALS — BP 120/84 | HR 91 | Temp 97.7°F | Wt 179.5 lb

## 2023-08-30 DIAGNOSIS — J189 Pneumonia, unspecified organism: Secondary | ICD-10-CM | POA: Diagnosis not present

## 2023-08-30 MED ORDER — AMOXICILLIN-POT CLAVULANATE 875-125 MG PO TABS: 1.0000 | ORAL_TABLET | Freq: Two times a day (BID) | ORAL | 0 refills | Status: AC

## 2023-08-30 NOTE — Progress Notes (Signed)
     Established Patient Office Visit     CC/Reason for Visit: Fatigue, cough, URI symptoms  HPI: Kim Kirk is a 48 y.o. female who is coming in today for the above mentioned reasons.  She developed what she thought was a URI 3 weeks ago.  She has not been able to "shake it off".  She continues to feel very fatigued, cough, at 1 point sputum was greenish but has reverted back to a clear yellow color.   Past Medical/Surgical History: Past Medical History:  Diagnosis Date   Asthma    allergy induced   Elevated BP without diagnosis of hypertension    Morbid obesity (HCC)    Rosacea     Past Surgical History:  Procedure Laterality Date   no surgicaL HISTORY      Social History:  reports that she has never smoked. She has never used smokeless tobacco. She reports current alcohol use of about 5.0 standard drinks of alcohol per week. She reports that she does not use drugs.  Allergies: Allergies  Allergen Reactions   Flagyl [Metronidazole] Rash    Family History:  Family History  Problem Relation Age of Onset   Hypertension Mother    Skin cancer Father    Diabetes Maternal Uncle    Diabetes Maternal Grandfather      Current Outpatient Medications:    amLODipine (NORVASC) 5 MG tablet, TAKE 1 TABLET(5 MG) BY MOUTH DAILY, Disp: 90 tablet, Rfl: 1   amoxicillin-clavulanate (AUGMENTIN) 875-125 MG tablet, Take 1 tablet by mouth 2 (two) times daily for 7 days., Disp: 14 tablet, Rfl: 0   atorvastatin (LIPITOR) 40 MG tablet, TAKE 1 TABLET(40 MG) BY MOUTH DAILY, Disp: 90 tablet, Rfl: 1   cetirizine (ZYRTEC ALLERGY) 10 MG tablet, Take 1 tablet (10 mg total) by mouth daily., Disp: 90 tablet, Rfl: 1   sertraline (ZOLOFT) 50 MG tablet, TAKE 1 TABLET(50 MG) BY MOUTH DAILY, Disp: 90 tablet, Rfl: 1   tirzepatide (ZEPBOUND) 15 MG/0.5ML Pen, Inject 15 mg into the skin once a week., Disp: 6 mL, Rfl: 2   Vitamin D, Ergocalciferol, (DRISDOL) 1.25 MG (50000 UNIT) CAPS capsule, Take 1  capsule (50,000 Units total) by mouth every 7 (seven) days for 12 doses., Disp: 12 capsule, Rfl: 0  Review of Systems:  Negative unless indicated in HPI.   Physical Exam: Vitals:   08/30/23 0705  BP: 120/84  Pulse: 91  Temp: 97.7 F (36.5 C)  TempSrc: Oral  SpO2: 99%  Weight: 179 lb 8 oz (81.4 kg)    Body mass index is 29.87 kg/m.   Physical Exam Pulmonary:     Effort: Pulmonary effort is normal.     Breath sounds: Examination of the left-lower field reveals rhonchi. Rhonchi present.      Impression and Plan:  Community acquired pneumonia of left lower lobe of lung -     Amoxicillin-Pot Clavulanate; Take 1 tablet by mouth 2 (two) times daily for 7 days.  Dispense: 14 tablet; Refill: 0   -Given crackles/rhonchi in left lower lung base, suspect pneumonia, will treat with Augmentin for 7 days.  Time spent:30 minutes reviewing chart, interviewing and examining patient and formulating plan of care.     Chaya Jan, MD [facility]

## 2023-08-31 ENCOUNTER — Other Ambulatory Visit: Payer: Self-pay | Admitting: Internal Medicine

## 2023-08-31 DIAGNOSIS — E785 Hyperlipidemia, unspecified: Secondary | ICD-10-CM

## 2023-09-24 DIAGNOSIS — J019 Acute sinusitis, unspecified: Secondary | ICD-10-CM | POA: Diagnosis not present

## 2023-09-24 DIAGNOSIS — B9789 Other viral agents as the cause of diseases classified elsewhere: Secondary | ICD-10-CM | POA: Diagnosis not present

## 2023-09-25 ENCOUNTER — Ambulatory Visit (HOSPITAL_COMMUNITY)
Admission: RE | Admit: 2023-09-25 | Discharge: 2023-09-25 | Disposition: A | Discharge status: Home / Self Care | Source: Ambulatory Visit | Attending: Interventional Cardiology | Admitting: Interventional Cardiology

## 2023-09-25 ENCOUNTER — Encounter: Payer: BC Managed Care – PPO | Admitting: Internal Medicine

## 2023-09-25 DIAGNOSIS — I77810 Thoracic aortic ectasia: Secondary | ICD-10-CM | POA: Insufficient documentation

## 2023-09-25 MED ORDER — IOHEXOL 350 MG/ML SOLN
75.0000 mL | Freq: Once | INTRAVENOUS | Status: AC | PRN
  Administered 2023-09-25: 75 mL via INTRAVENOUS

## 2023-09-27 ENCOUNTER — Ambulatory Visit: Payer: BC Managed Care – PPO | Admitting: Internal Medicine

## 2023-09-27 ENCOUNTER — Encounter: Payer: Self-pay | Admitting: Internal Medicine

## 2023-09-27 VITALS — BP 113/76 | HR 74 | Temp 98.6°F | Resp 16 | Ht 65.0 in | Wt 183.0 lb

## 2023-09-27 DIAGNOSIS — I10 Essential (primary) hypertension: Secondary | ICD-10-CM | POA: Diagnosis not present

## 2023-09-27 DIAGNOSIS — E559 Vitamin D deficiency, unspecified: Secondary | ICD-10-CM

## 2023-09-27 DIAGNOSIS — J4520 Mild intermittent asthma, uncomplicated: Secondary | ICD-10-CM

## 2023-09-27 DIAGNOSIS — J302 Other seasonal allergic rhinitis: Secondary | ICD-10-CM

## 2023-09-27 DIAGNOSIS — E782 Mixed hyperlipidemia: Secondary | ICD-10-CM | POA: Diagnosis not present

## 2023-09-27 DIAGNOSIS — Z0000 Encounter for general adult medical examination without abnormal findings: Secondary | ICD-10-CM

## 2023-09-27 DIAGNOSIS — Z1159 Encounter for screening for other viral diseases: Secondary | ICD-10-CM

## 2023-09-27 DIAGNOSIS — Z114 Encounter for screening for human immunodeficiency virus [HIV]: Secondary | ICD-10-CM

## 2023-09-27 LAB — VITAMIN D 25 HYDROXY (VIT D DEFICIENCY, FRACTURES): VITD: 20.38 ng/mL — ABNORMAL LOW (ref 30.00–100.00)

## 2023-09-27 LAB — COMPREHENSIVE METABOLIC PANEL WITH GFR
ALT: 21 U/L (ref 0–35)
AST: 16 U/L (ref 0–37)
Albumin: 4.7 g/dL (ref 3.5–5.2)
Alkaline Phosphatase: 60 U/L (ref 39–117)
BUN: 12 mg/dL (ref 6–23)
CO2: 23 meq/L (ref 19–32)
Calcium: 9.5 mg/dL (ref 8.4–10.5)
Chloride: 104 meq/L (ref 96–112)
Creatinine, Ser: 0.84 mg/dL (ref 0.40–1.20)
GFR: 83.07 mL/min (ref >60.00)
Glucose, Bld: 95 mg/dL (ref 70–99)
Potassium: 4.2 meq/L (ref 3.5–5.1)
Sodium: 136 meq/L (ref 135–145)
Total Bilirubin: 0.7 mg/dL (ref 0.2–1.2)
Total Protein: 7.7 g/dL (ref 6.0–8.3)

## 2023-09-27 LAB — CBC WITH DIFFERENTIAL/PLATELET
Basophils Absolute: 0 10*3/uL (ref 0.0–0.1)
Basophils Relative: 1 % (ref 0.0–3.0)
Eosinophils Absolute: 0.2 10*3/uL (ref 0.0–0.7)
Eosinophils Relative: 4 % (ref 0.0–5.0)
HCT: 43.8 % (ref 36.0–46.0)
Hemoglobin: 15 g/dL (ref 12.0–15.0)
Lymphocytes Relative: 24.8 % (ref 12.0–46.0)
Lymphs Abs: 1.2 10*3/uL (ref 0.7–4.0)
MCHC: 34.2 g/dL (ref 30.0–36.0)
MCV: 95.1 fl (ref 78.0–100.0)
Monocytes Absolute: 0.4 10*3/uL (ref 0.1–1.0)
Monocytes Relative: 8.3 % (ref 3.0–12.0)
Neutro Abs: 3.1 10*3/uL (ref 1.4–7.7)
Neutrophils Relative %: 61.9 % (ref 43.0–77.0)
Platelets: 300 10*3/uL (ref 150.0–400.0)
RBC: 4.61 Mil/uL (ref 3.87–5.11)
RDW: 12.7 % (ref 11.5–15.5)
WBC: 5 10*3/uL (ref 4.0–10.5)

## 2023-09-27 LAB — LIPID PANEL
Cholesterol: 172 mg/dL (ref 0–200)
HDL: 49.5 mg/dL (ref >39.00)
LDL Cholesterol: 104 mg/dL — ABNORMAL HIGH (ref 0–99)
NonHDL: 122.58
Total CHOL/HDL Ratio: 3
Triglycerides: 92 mg/dL (ref 0.0–149.0)
VLDL: 18.4 mg/dL (ref 0.0–40.0)

## 2023-09-27 LAB — TSH: TSH: 1.63 u[IU]/mL (ref 0.35–5.50)

## 2023-09-27 LAB — VITAMIN B12: Vitamin B-12: 490 pg/mL (ref 211–911)

## 2023-09-27 MED ORDER — FLUTICASONE PROPIONATE 50 MCG/ACT NA SUSP: 2.0000 | Freq: Every day | NASAL | 6 refills | Status: AC

## 2023-09-27 MED ORDER — ALBUTEROL SULFATE HFA 108 (90 BASE) MCG/ACT IN AERS: 2.0000 | INHALATION_SPRAY | Freq: Four times a day (QID) | RESPIRATORY_TRACT | 2 refills | Status: AC | PRN

## 2023-09-27 NOTE — Progress Notes (Signed)
 Established Patient Office Visit     CC/Reason for Visit: Annual preventive exam, discuss acute issues and medication refills  HPI: Kim Kirk is a 48 y.o. female who is coming in today for the above mentioned reasons. Past Medical History is significant for: Obesity who has done well on Zepbound  down to a BMI of 30, seasonal allergies, hyperlipidemia, GAD.  She has routine eye and dental care.  Is due for GYN and mammogram.  She needs a refill of Flonase .  She has been having some mild wheezing due to severe coughing and drainage from allergies.   Past Medical/Surgical History: Past Medical History:  Diagnosis Date   Asthma    allergy induced   Elevated BP without diagnosis of hypertension    Morbid obesity (HCC)    Rosacea     Past Surgical History:  Procedure Laterality Date   no surgicaL HISTORY      Social History:  reports that she has never smoked. She has never used smokeless tobacco. She reports current alcohol use of about 5.0 standard drinks of alcohol per week. She reports that she does not use drugs.  Allergies: Allergies  Allergen Reactions   Flagyl [Metronidazole] Rash    Family History:  Family History  Problem Relation Age of Onset   Hypertension Mother    Skin cancer Father    Diabetes Maternal Uncle    Diabetes Maternal Grandfather      Current Outpatient Medications:    albuterol  (VENTOLIN  HFA) 108 (90 Base) MCG/ACT inhaler, Inhale 2 puffs into the lungs every 6 (six) hours as needed for wheezing or shortness of breath., Disp: 8 g, Rfl: 2   amLODipine  (NORVASC ) 5 MG tablet, TAKE 1 TABLET(5 MG) BY MOUTH DAILY, Disp: 90 tablet, Rfl: 1   atorvastatin  (LIPITOR) 40 MG tablet, TAKE 1 TABLET(40 MG) BY MOUTH DAILY, Disp: 90 tablet, Rfl: 1   cetirizine  (ZYRTEC  ALLERGY) 10 MG tablet, Take 1 tablet (10 mg total) by mouth daily., Disp: 90 tablet, Rfl: 1   fluticasone  (FLONASE ) 50 MCG/ACT nasal spray, Place 2 sprays into both nostrils daily.,  Disp: 16 g, Rfl: 6   sertraline  (ZOLOFT ) 50 MG tablet, TAKE 1 TABLET(50 MG) BY MOUTH DAILY, Disp: 90 tablet, Rfl: 1   tirzepatide  (ZEPBOUND ) 15 MG/0.5ML Pen, Inject 15 mg into the skin once a week., Disp: 6 mL, Rfl: 2  Review of Systems:  Negative unless indicated in HPI.   Physical Exam: Vitals:   09/27/23 0711  BP: 113/76  Pulse: 74  Resp: 16  Temp: 98.6 F (37 C)  TempSrc: Oral  SpO2: 99%  Weight: 183 lb (83 kg)  Height: 5\' 5"  (1.651 m)    Body mass index is 30.45 kg/m.   Physical Exam Vitals reviewed.  Constitutional:      General: She is not in acute distress.    Appearance: Normal appearance. She is not ill-appearing, toxic-appearing or diaphoretic.  HENT:     Head: Normocephalic.     Right Ear: Tympanic membrane, ear canal and external ear normal. There is no impacted cerumen.     Left Ear: Tympanic membrane, ear canal and external ear normal. There is no impacted cerumen.     Nose: Nose normal.     Mouth/Throat:     Mouth: Mucous membranes are moist.     Pharynx: Oropharynx is clear. No oropharyngeal exudate or posterior oropharyngeal erythema.  Eyes:     General: No scleral icterus.  Right eye: No discharge.        Left eye: No discharge.     Conjunctiva/sclera: Conjunctivae normal.     Pupils: Pupils are equal, round, and reactive to light.  Neck:     Vascular: No carotid bruit.  Cardiovascular:     Rate and Rhythm: Normal rate and regular rhythm.     Pulses: Normal pulses.     Heart sounds: Normal heart sounds.  Pulmonary:     Effort: Pulmonary effort is normal. No respiratory distress.     Breath sounds: Normal breath sounds.  Abdominal:     General: Abdomen is flat. Bowel sounds are normal.     Palpations: Abdomen is soft.  Musculoskeletal:        General: Normal range of motion.     Cervical back: Normal range of motion.  Skin:    General: Skin is warm and dry.  Neurological:     General: No focal deficit present.     Mental Status:  She is alert and oriented to person, place, and time. Mental status is at baseline.  Psychiatric:        Mood and Affect: Mood normal.        Behavior: Behavior normal.        Thought Content: Thought content normal.        Judgment: Judgment normal.     Flowsheet Row Office Visit from 08/30/2023 in [facility] [place]  PHQ-9 Total Score 7        Impression and Plan:  Encounter for preventive health examination  Vitamin D  deficiency -     TSH; Future -     Vitamin B12; Future -     VITAMIN D  25 Hydroxy (Vit-D Deficiency, Fractures); Future  Primary hypertension -     CBC with Differential/Platelet; Future -     Comprehensive metabolic panel with GFR; Future  Mixed hyperlipidemia -     Lipid panel; Future  Encounter for hepatitis C screening test for low risk patient -     Hepatitis C antibody; Future  Encounter for screening for HIV -     HIV Antibody (routine testing w rflx); Future  Seasonal allergies -     Fluticasone  Propionate; Place 2 sprays into both nostrils daily.  Dispense: 16 g; Refill: 6  Mild intermittent reactive airway disease without complication -     Albuterol  Sulfate HFA; Inhale 2 puffs into the lungs every 6 (six) hours as needed for wheezing or shortness of breath.  Dispense: 8 g; Refill: 2  Morbid obesity (HCC)   -Recommend routine eye and dental care. -Healthy lifestyle discussed in detail. -Labs to be updated today. -Prostate cancer screening: N/A Health Maintenance  Topic Date Due   HIV Screening  Never done   Hepatitis C Screening  Never done   Pneumococcal Vaccination (1 of 2 - PCV) Never done   Pap with HPV screening  02/11/2017   COVID-19 Vaccine (4 - 2024-25 season) 02/04/2023   Flu Shot  01/04/2024   Colon Cancer Screening  09/25/2030   DTaP/Tdap/Td vaccine (3 - Td or Tdap) 09/24/2032   HPV Vaccine  Aged Out   Meningitis B Vaccine  Aged Out    - Refill Flonase  for seasonal allergies.  She has a  touch of airway disease with mild wheezing, will send in albuterol  inhaler to use as needed. - Continue Zepbound  15 mg for obesity.  She has done quite well on it. - She will  schedule follow-up with GYN for well woman exam and breast cancer screening.     Marguerita Shih, MD [facility]

## 2023-09-29 LAB — HEPATITIS C ANTIBODY: Hepatitis C Ab: NONREACTIVE

## 2023-09-29 LAB — HIV ANTIBODY (ROUTINE TESTING W REFLEX): HIV 1&2 Ab, 4th Generation: NONREACTIVE

## 2023-10-01 ENCOUNTER — Other Ambulatory Visit: Payer: Self-pay | Admitting: Internal Medicine

## 2023-10-01 ENCOUNTER — Encounter: Payer: Self-pay | Admitting: Internal Medicine

## 2023-10-01 DIAGNOSIS — E559 Vitamin D deficiency, unspecified: Secondary | ICD-10-CM

## 2023-10-01 MED ORDER — VITAMIN D (ERGOCALCIFEROL) 1.25 MG (50000 UNIT) PO CAPS: 50000.0000 [IU] | ORAL_CAPSULE | ORAL | 0 refills | Status: AC

## 2023-10-15 ENCOUNTER — Encounter: Payer: Self-pay | Admitting: *Deleted

## 2023-10-15 IMAGING — CT CT ANGIO CHEST
3 of 8 series · 19 of 46 positions shown · IV contrast (OMNIPAQUE 350)
Comparison: None.

CLINICAL DATA: Thoracic aortic aneurysm (BASTIAN IGNACIO) suspected dilated
ascending aorta

EXAM:
CT ANGIOGRAPHY CHEST WITH CONTRAST
TECHNIQUE: Multidetector CT imaging of the chest was performed using the
standard protocol during bolus administration of intravenous
contrast. Multiplanar CT image reconstructions and MIPs were
obtained to evaluate the vascular anatomy.

[Series 4: aorta 3.0 bf37 2 · axial · 0.63mm/px · z∈[-346,-46]mm · 13 of 118 slices shown]
[im 9/118  lung]
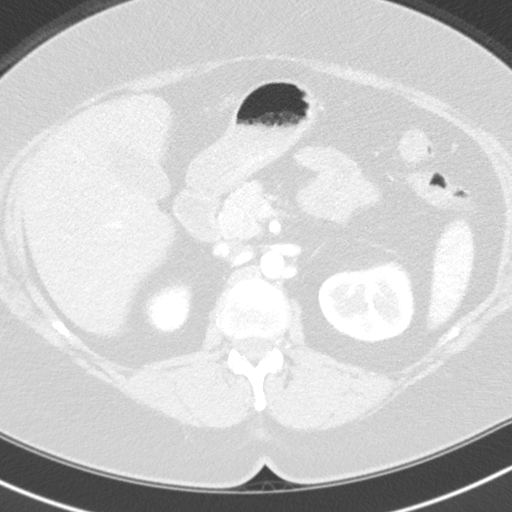
[im 17/118  soft-tissue]
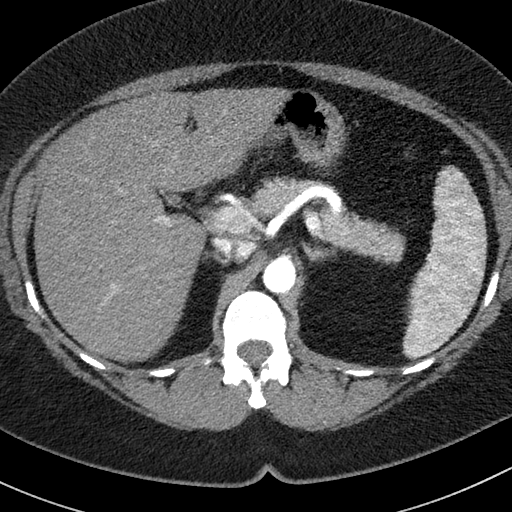
[im 26/118  lung]
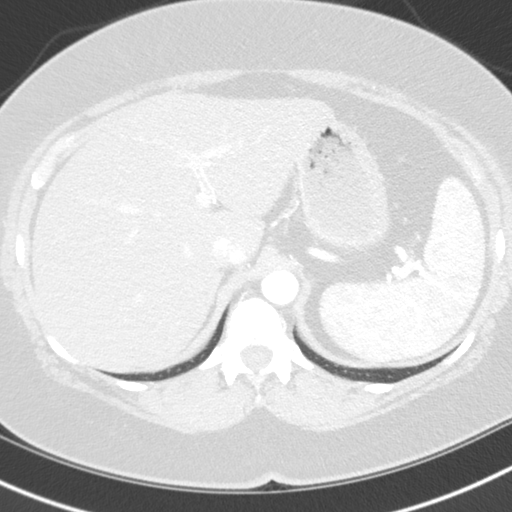
[im 34/118  soft-tissue]
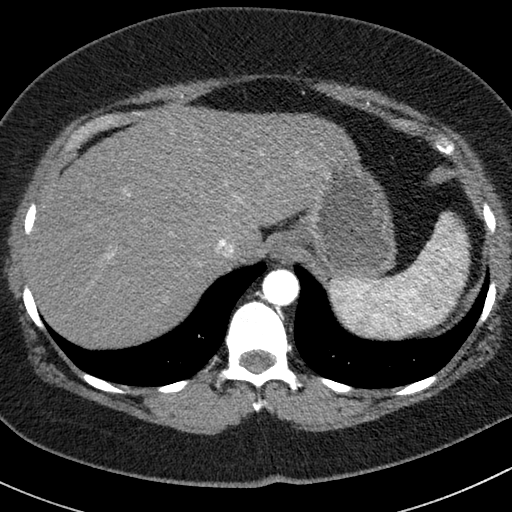
[im 42/118  lung]
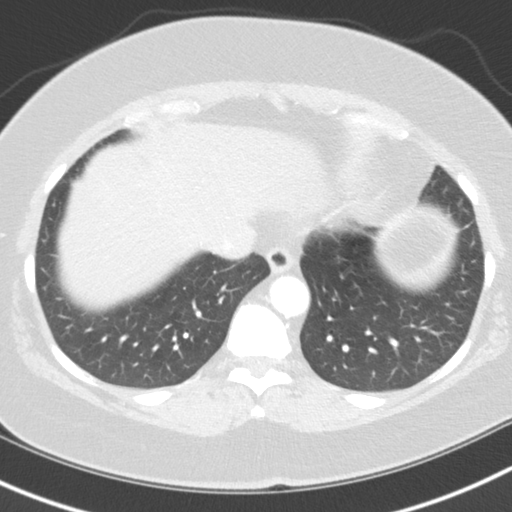
[im 51/118  soft-tissue]
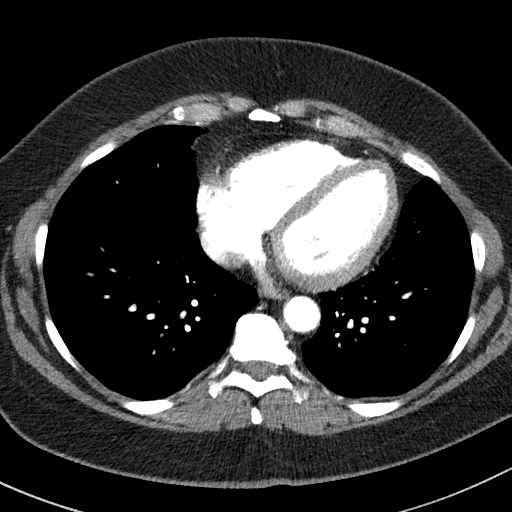
[im 59/118  lung]
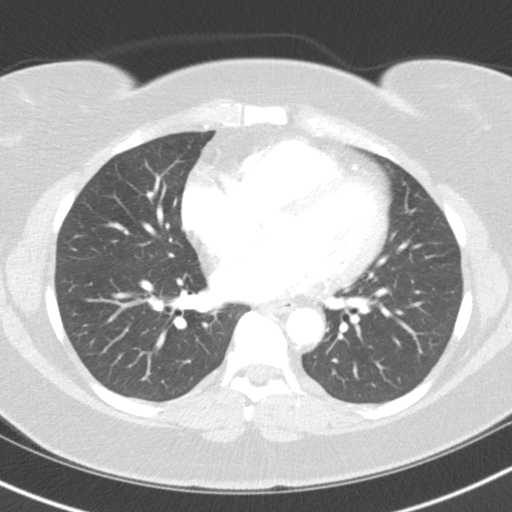
[im 67/118  soft-tissue]
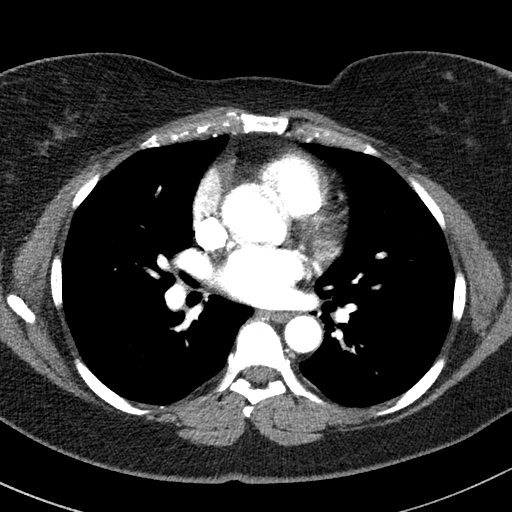
[im 76/118  lung]
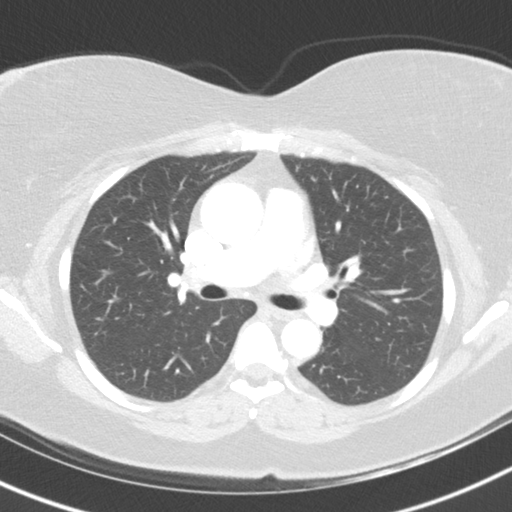
[im 84/118  soft-tissue]
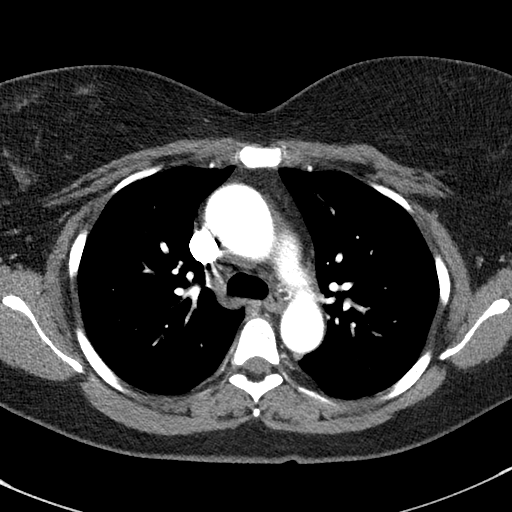
[im 92/118  lung]
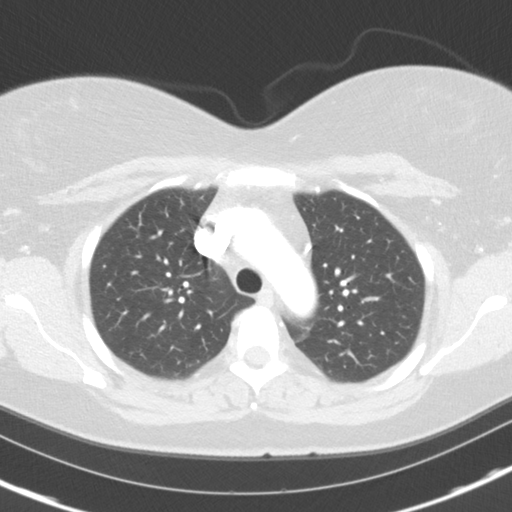
[im 101/118  soft-tissue]
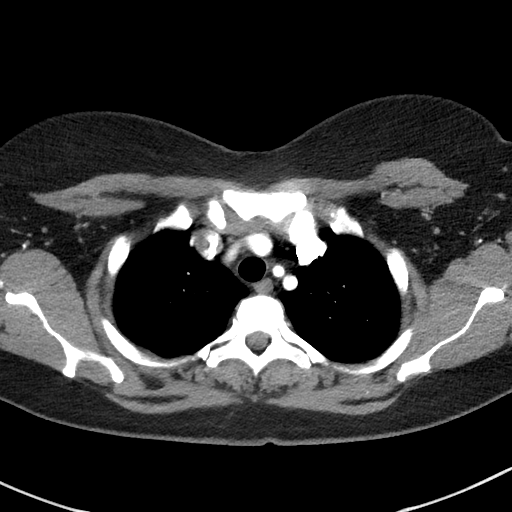
[im 109/118  lung]
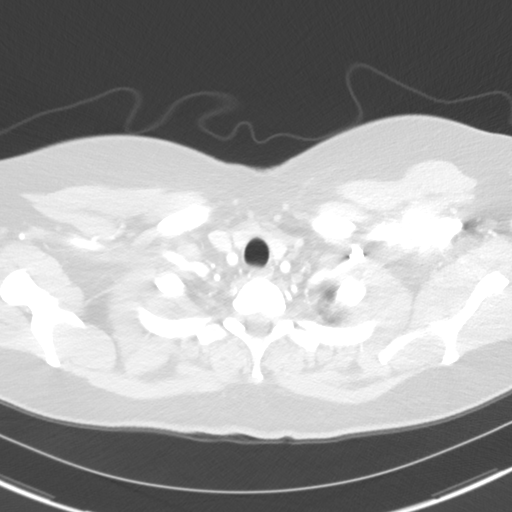

[Series 5: lung · axial · 0.63mm/px · z∈[-346,-246]mm · 3 of 118 slices shown]
[im 9/118  soft-tissue]
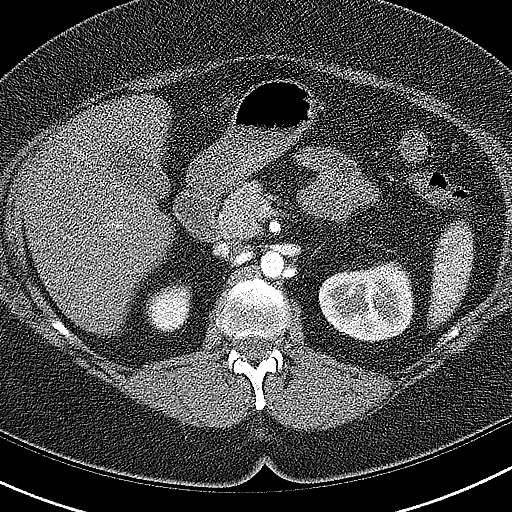
[im 26/118  soft-tissue]
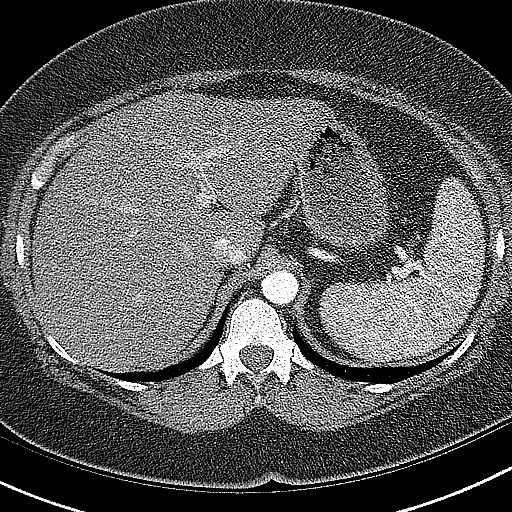
[im 42/118  soft-tissue]
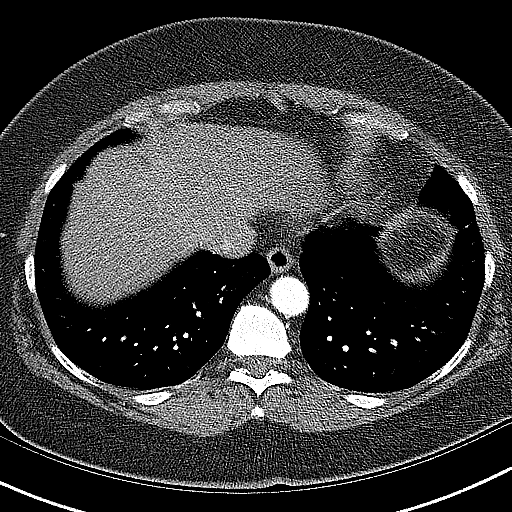

[Series 7: coronals · coronal · 0.69mm/px · 3 of 163 slices shown]
[im 41/163  soft-tissue]
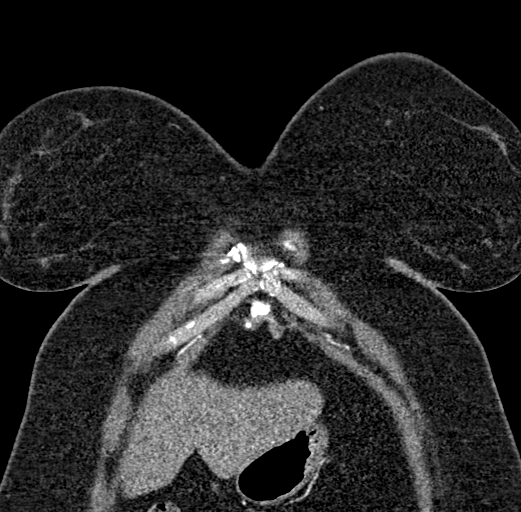
[im 82/163  soft-tissue]
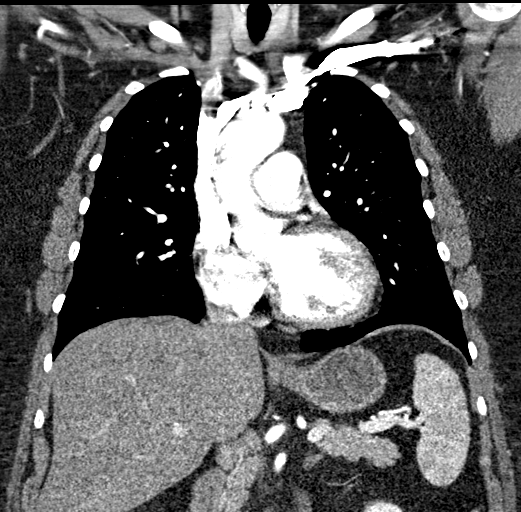
[im 122/163  soft-tissue]
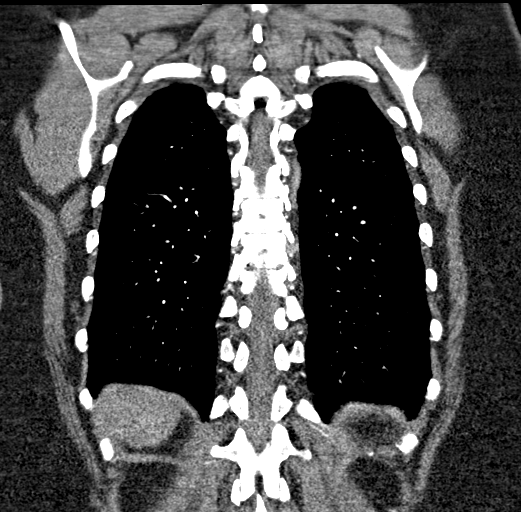

[19 of 46 positions shown; findings below may reference images not displayed]

RADIATION DOSE REDUCTION: This exam was performed according to the
departmental dose-optimization program which includes automated
exposure control, adjustment of the mA and/or kV according to
patient size and/or use of iterative reconstruction technique.

CONTRAST:  100mL OMNIPAQUE IOHEXOL 350 MG/ML SOLN
FINDINGS: Cardiovascular: Preferential opacification of the thoracic aorta.
Borderline aneurysm of the ascending thoracic aorta measures 4.0 x
3.8 cm. The pulmonary arteries are normal in caliber without
proximal filling defect. Normal heart size. No pericardial effusion.

Mediastinum/Nodes: No enlarged mediastinal, hilar, or axillary lymph
nodes. The thyroid gland appears normal.

Lungs/Pleura: No pleural effusion. No pneumothorax. No mass or focal
consolidation. No suspicious pulmonary nodules.

Musculoskeletal: No aggressive osseous lesions.

Upper abdomen: The visualized upper abdomen is unremarkable.

Review of the MIP images confirms the above findings.
IMPRESSION: Borderline aneurysm of the ascending thoracic aorta measures 4.0 x
3.8 cm. Recommend annual imaging followup by CTA or MRA. This
recommendation follows 0202
ACCF/AHA/AATS/ACR/ASA/SCA/ACOIDAN/EBADAT/PHAM/BUKI Guidelines for the
Diagnosis and Management of Patients with Thoracic Aortic Disease.
Circulation. 0202; 121: E266-e369. Aortic aneurysm NOS ([id number].P)

## 2023-10-30 ENCOUNTER — Telehealth: Payer: Self-pay

## 2023-10-30 NOTE — Telephone Encounter (Signed)
-----   Message from Nurse Devra Fontana A sent at 10/18/2023  4:10 PM EDT ----- Regarding: new cardiologist Patient previously saw Dr Jacquelynn Matter and needs to be scheduled to see a new cardiologist.  Can you contact her to schedule an appointment? Thanks [employer]

## 2023-10-30 NOTE — Telephone Encounter (Signed)
 Patient contacted 3x with no success in scheduling f/u with new cardiologist.will send mychart message

## 2023-11-01 ENCOUNTER — Other Ambulatory Visit: Payer: Self-pay | Admitting: Internal Medicine

## 2023-11-01 DIAGNOSIS — I10 Essential (primary) hypertension: Secondary | ICD-10-CM

## 2023-12-12 DIAGNOSIS — Z1231 Encounter for screening mammogram for malignant neoplasm of breast: Secondary | ICD-10-CM | POA: Diagnosis not present

## 2023-12-12 DIAGNOSIS — Z01419 Encounter for gynecological examination (general) (routine) without abnormal findings: Secondary | ICD-10-CM | POA: Diagnosis not present

## 2023-12-12 DIAGNOSIS — Z6830 Body mass index (BMI) 30.0-30.9, adult: Secondary | ICD-10-CM | POA: Diagnosis not present

## 2023-12-18 ENCOUNTER — Other Ambulatory Visit: Payer: Self-pay | Admitting: Obstetrics and Gynecology

## 2023-12-18 DIAGNOSIS — R928 Other abnormal and inconclusive findings on diagnostic imaging of breast: Secondary | ICD-10-CM

## 2023-12-19 ENCOUNTER — Ambulatory Visit: Admitting: Cardiology

## 2023-12-24 ENCOUNTER — Ambulatory Visit

## 2023-12-24 ENCOUNTER — Ambulatory Visit
Admission: RE | Admit: 2023-12-24 | Discharge: 2023-12-24 | Disposition: A | Discharge status: Home / Self Care | Source: Ambulatory Visit | Attending: Obstetrics and Gynecology | Admitting: Obstetrics and Gynecology

## 2023-12-24 DIAGNOSIS — R922 Inconclusive mammogram: Secondary | ICD-10-CM | POA: Diagnosis not present

## 2023-12-24 DIAGNOSIS — R928 Other abnormal and inconclusive findings on diagnostic imaging of breast: Secondary | ICD-10-CM

## 2024-01-03 ENCOUNTER — Ambulatory Visit: Admitting: Cardiology

## 2024-01-16 ENCOUNTER — Ambulatory Visit: Attending: Cardiovascular Disease | Admitting: Cardiology

## 2024-01-16 ENCOUNTER — Encounter: Payer: Self-pay | Admitting: Cardiology

## 2024-01-16 VITALS — BP 128/88 | HR 91 | Resp 16 | Ht 65.0 in | Wt 185.8 lb

## 2024-01-16 DIAGNOSIS — I77810 Thoracic aortic ectasia: Secondary | ICD-10-CM | POA: Diagnosis not present

## 2024-01-16 DIAGNOSIS — E782 Mixed hyperlipidemia: Secondary | ICD-10-CM | POA: Diagnosis not present

## 2024-01-16 DIAGNOSIS — I359 Nonrheumatic aortic valve disorder, unspecified: Secondary | ICD-10-CM

## 2024-01-16 DIAGNOSIS — I10 Essential (primary) hypertension: Secondary | ICD-10-CM | POA: Diagnosis not present

## 2024-01-16 MED ORDER — LOSARTAN POTASSIUM 25 MG PO TABS: 25.0000 mg | ORAL_TABLET | Freq: Every day | ORAL | 3 refills | Status: DC

## 2024-01-16 NOTE — Progress Notes (Signed)
 Cardiology Office Note:  .   Date:  01/16/2024  ID:  Kim Kirk, DOB 1975-12-17, MRN [id number] PCP:  Theophilus Andrews, Tully GRADE, MD  Former Cardiology Providers: Dr. Candyce Reek [facility] Providers Cardiologist:  Madonna Large, DO , [facility] (established care 01/16/24) Electrophysiologist:  None  Click to update primary MD,subspecialty MD or APP then REFRESH:1}    Chief Complaint  Patient presents with   Ascending aorta dilatation   Follow-up    History of Present Illness: .   Kim Kirk is a 48 y.o. Caucasian female whose past medical history and cardiovascular risk factors includes: Aortic valve disease, ascending aorta dilatation, hypertension, hyperlipidemia, anxiety.  Formally under the care of Dr. Candyce Reek who last saw Kim Kirk back in October 2022. I am seeing her for the first time to re-establishing care.  Patient has a long history of a cardiac murmur for which she is being followed for now presents to the office to reestablish care.  Since last office visit patient denies any anginal chest pain or heart failure symptoms.  No hospitalizations or urgent care visits for cardiovascular reasons.  She has been compliant with her medical therapy.  No significant weight gain.  Physical endurance remains stable but no structured exercise program or daily routine. Does not her blood pressures regularly but when checked her SBP are around .  No family history of aortic syndromes.    Review of Systems: .   Review of Systems  Cardiovascular:  Negative for chest pain, claudication, irregular heartbeat, leg swelling, near-syncope, orthopnea, palpitations, paroxysmal nocturnal dyspnea and syncope.  Respiratory:  Negative for shortness of breath.   Hematologic/Lymphatic: Negative for bleeding problem.    Studies Reviewed:   EKG: EKG Interpretation Date/Time:  Wednesday January 16 2024 08:00:00 EDT Ventricular Rate:  93 PR  Interval:  172 QRS Duration:  94 QT Interval:  406 QTC Calculation: 504 R Axis:   56  Text Interpretation: Normal sinus rhythm Right atrial enlargement Incomplete right bundle branch block Prolonged QT No previous ECGs available Confirmed by Large Madonna [phone]) on 01/16/2024 8:07:34 AM  Echocardiogram: 12/2020  1. Left ventricular ejection fraction, by estimation, is 60 to 65%. The  left ventricle has normal function. The left ventricle has no regional  wall motion abnormalities. Left ventricular diastolic parameters were  normal.   2. Right ventricular systolic function is normal. The right ventricular  size is normal. There is normal pulmonary artery systolic pressure.   3. The mitral valve is normal in structure. No evidence of mitral valve  regurgitation. No evidence of mitral stenosis.   4. Appears tri leaflet small gradient across AV AVA 2.34 cm2. The aortic  valve was not well visualized. There is mild calcification of the aortic  valve. Aortic valve regurgitation is mild. Mild aortic valve sclerosis is  present, with no evidence of  aortic valve stenosis.   5. Aortic dilatation noted. There is moderate dilatation of the ascending  aorta, measuring 40 mm.   6. The inferior vena cava is normal in size with greater than 50%  respiratory variability, suggesting right atrial pressure of 3 mmHg.   RADIOLOGY: CTA aorta protocol 09/30/2023 1. Stable appearance of the tubular ascending thoracic aorta measuring 4.0 cm. 2. Recommend annual imaging followup by CTA or MRA. This recommendation follows 2010 ACCF/AHA/AATS/ACR/ASA/SCA/SCAI/SIR/STS/SVM Guidelines for the Diagnosis and Management of Patients with Thoracic Aortic Disease. Circulation. 2010; 121: Z733-z630. Aortic aneurysm NOS (ICD10-I71.9)  Risk Assessment/Calculations:   NA  Labs:       Latest Ref Rng & Units 09/27/2023    7:24 AM 09/25/2022    7:32 AM 01/09/2020    7:58 AM  CBC  WBC 4.0 - 10.5 K/uL 5.0  6.0  7.9    Hemoglobin 12.0 - 15.0 g/dL 84.9  85.0  85.3   Hematocrit 36.0 - 46.0 % 43.8  43.0  43.8   Platelets 150.0 - 400.0 K/uL 300.0  316.0  333        Latest Ref Rng & Units 09/27/2023    7:24 AM 09/25/2022    7:32 AM 09/15/2020    7:53 AM  BMP  Glucose 70 - 99 mg/dL 95  882  881   BUN 6 - 23 mg/dL 12  8  11    Creatinine 0.40 - 1.20 mg/dL 9.15  9.14  9.19   Sodium 135 - 145 mEq/L 136  137  140   Potassium 3.5 - 5.1 mEq/L 4.2  4.0  3.7   Chloride 96 - 112 mEq/L 104  104  100   CO2 19 - 32 mEq/L 23  25  28    Calcium  8.4 - 10.5 mg/dL 9.5  9.4  9.6       Latest Ref Rng & Units 09/27/2023    7:24 AM 09/25/2022    7:32 AM 09/15/2020    7:53 AM  CMP  Glucose 70 - 99 mg/dL 95  882  881   BUN 6 - 23 mg/dL 12  8  11    Creatinine 0.40 - 1.20 mg/dL 9.15  9.14  9.19   Sodium 135 - 145 mEq/L 136  137  140   Potassium 3.5 - 5.1 mEq/L 4.2  4.0  3.7   Chloride 96 - 112 mEq/L 104  104  100   CO2 19 - 32 mEq/L 23  25  28    Calcium  8.4 - 10.5 mg/dL 9.5  9.4  9.6   Total Protein 6.0 - 8.3 g/dL 7.7  7.1    Total Bilirubin 0.2 - 1.2 mg/dL 0.7  0.4    Alkaline Phos 39 - 117 U/L 60  67    AST 0 - 37 U/L 16  13    ALT 0 - 35 U/L 21  14      Lab Results  Component Value Date   CHOL 172 09/27/2023   HDL 49.50 09/27/2023   LDLCALC 104 (H) 09/27/2023   LDLDIRECT 152.0 09/25/2022   TRIG 92.0 09/27/2023   CHOLHDL 3 09/27/2023   No results for input(s): LIPOA in the last 8760 hours. No components found for: NTPROBNP No results for input(s): PROBNP in the last 8760 hours. Recent Labs    09/27/23 0724  TSH 1.63    Physical Exam:    Today's Vitals   01/16/24 0757  BP: 128/88  Pulse: 91  Resp: 16  SpO2: 98%  Weight: 185 lb 12.8 oz (84.3 kg)  Height: 5' 5 (1.651 m)   Body mass index is 30.92 kg/m. Wt Readings from Last 3 Encounters:  01/16/24 185 lb 12.8 oz (84.3 kg)  09/27/23 183 lb (83 kg)  08/30/23 179 lb 8 oz (81.4 kg)    Physical Exam  Constitutional: No distress.   hemodynamically stable  Neck: No JVD present.  Cardiovascular: Normal rate, regular rhythm, S1 normal and S2 normal. Exam reveals no gallop, no S3 and no S4.  Murmur heard. High-pitched blowing decrescendo early diastolic murmur is present with a grade of 3/4 at  the upper right sternal border. Pulmonary/Chest: Effort normal and breath sounds normal. No stridor. She has no wheezes. She has no rales.  Musculoskeletal:        General: No edema.     Cervical back: Neck supple.  Skin: Skin is warm.   Impression & Recommendation(s):  Impression:   ICD-10-CM   1. Aortic valve disease  I35.9     2. Ascending aorta dilatation (HCC)  I77.810 EKG 12-Lead    CT ANGIO CHEST AORTA W/CM & OR WO/CM    3. Benign hypertension  I10 Basic metabolic panel with GFR    losartan  (COZAAR ) 25 MG tablet    Basic metabolic panel with GFR    4. Mixed hyperlipidemia  E78.2        Recommendation(s):  Aortic valve disease No history of a cardiac murmur for the last several years. Prior echocardiogram noted mild aortic regurgitation. Overall asymptomatic. Reemphasized importance of blood pressure management  Ascending aorta dilatation [facility]) Had a CTA aorta protocol in April 2025: Ascending aorta 40 mm Office blood pressures are well-controlled. However, recommended discontinuation of amlodipine  and transitioning to ARB given aortic dilatation Recommended the importance of monitoring blood pressures regularly to make sure they are well-controlled in the ambulatory setting Reemphasized importance of low-salt diet. You should avoid use of Ciprofloxacin and other associated antibiotics (flouroquinolone antibiotics ) It is  best to avoid activities that cause grunting or straining (medically referred to as a "valsalva maneuver"). This happens when a person bears down against a closed throat to increase the strength of arm or abdominal muscles. There's often a tendency to do this when lifting heavy weights,  doing sit-ups, push-ups or chin-ups, etc., but it may be harmful.  Will repeat CTA aorta protocol July 2026 with follow-up in August 2026  Benign hypertension Discontinue amlodipine  5 mg p.o. daily. Start losartan  25 mg p.o. every morning. BMP in one week to check renal function and electrolytes  Mixed hyperlipidemia Currently on Lipitor 40 mg p.o. daily. Followed by PCP.   Orders Placed:  Orders Placed This Encounter  Procedures   CT ANGIO CHEST AORTA W/CM & OR WO/CM    Standing Status:   Future    Expected Date:   12/03/2024    If indicated for the ordered procedure, I authorize the administration of contrast media per Radiology protocol:   Yes    Initiate TAVR/CTA/Valve Evaluation Adult Protocol:   Yes    Does the patient have a contrast media/X-ray dye allergy?:   No    [facility]    Is patient pregnant?:   No   Basic metabolic panel with GFR    Standing Status:   Future    Number of Occurrences:   1    Expected Date:   01/23/2024    Expiration Date:   01/15/2025   EKG 12-Lead     Final Medication List:    Meds ordered this encounter  Medications   losartan  (COZAAR ) 25 MG tablet    Sig: Take 1 tablet (25 mg total) by mouth daily.    Dispense:  30 tablet    Refill:  3    Medications Discontinued During This Encounter  Medication Reason   amLODipine  (NORVASC ) 5 MG tablet Change in therapy     Current Outpatient Medications:    albuterol  (VENTOLIN  HFA) 108 (90 Base) MCG/ACT inhaler, Inhale 2 puffs into the lungs every 6 (six) hours as needed for wheezing or  shortness of breath., Disp: 8 g, Rfl: 2   atorvastatin  (LIPITOR) 40 MG tablet, TAKE 1 TABLET(40 MG) BY MOUTH DAILY, Disp: 90 tablet, Rfl: 1   cetirizine  (ZYRTEC  ALLERGY) 10 MG tablet, Take 1 tablet (10 mg total) by mouth daily., Disp: 90 tablet, Rfl: 1   fluticasone  (FLONASE ) 50 MCG/ACT nasal spray, Place 2 sprays into both nostrils daily., Disp: 16 g, Rfl: 6   losartan   (COZAAR ) 25 MG tablet, Take 1 tablet (25 mg total) by mouth daily., Disp: 30 tablet, Rfl: 3   sertraline  (ZOLOFT ) 50 MG tablet, TAKE 1 TABLET(50 MG) BY MOUTH DAILY, Disp: 90 tablet, Rfl: 1   tirzepatide  (ZEPBOUND ) 15 MG/0.5ML Pen, Inject 15 mg into the skin once a week., Disp: 6 mL, Rfl: 2  Consent:   NA  Disposition:   August 2026 or sooner if needed  Her questions and concerns were addressed to her satisfaction. She voices understanding of the recommendations provided during this encounter.    Signed, Madonna Michele HAS, [facility]  A Division of [facility] [address] [place], [place] [id number]  [place], [place] [address] 01/16/2024 11:04 AM

## 2024-01-16 NOTE — Patient Instructions (Signed)
 Medication Instructions:  STOP Amlodipine   START Losartan  25 mg once daily in the morning  *If you need a refill on your cardiac medications before your next appointment, please call your pharmacy*  Lab Work: To be completed in 1 week: BMP  If you have labs (blood work) drawn today and your tests are completely normal, you will receive your results only by: MyChart Message (if you have MyChart) OR A paper copy in the mail If you have any lab test that is abnormal or we need to change your treatment, we will call you to review the results.  Testing/Procedures: Your provider has requested that you have a CT Aorta in July 2026.  Follow-Up: At [facility], you and your health needs are our priority.  As part of our continuing mission to provide you with exceptional heart care, our providers are all part of one team.  This team includes your primary Cardiologist (physician) and Advanced Practice Providers or APPs (Physician Assistants and Nurse Practitioners) who all work together to provide you with the care you need, when you need it.  Your next appointment:   1 year(s) (August 2026)  Provider:   Madonna Large, DO

## 2024-04-11 ENCOUNTER — Encounter: Payer: Self-pay | Admitting: Internal Medicine

## 2024-04-11 DIAGNOSIS — E785 Hyperlipidemia, unspecified: Secondary | ICD-10-CM

## 2024-04-11 DIAGNOSIS — F411 Generalized anxiety disorder: Secondary | ICD-10-CM

## 2024-04-11 DIAGNOSIS — L719 Rosacea, unspecified: Secondary | ICD-10-CM

## 2024-04-14 MED ORDER — SERTRALINE HCL 50 MG PO TABS: ORAL_TABLET | ORAL | 1 refills | Status: AC

## 2024-04-14 MED ORDER — CETIRIZINE HCL 10 MG PO TABS: 10.0000 mg | ORAL_TABLET | Freq: Every day | ORAL | 1 refills | Status: AC

## 2024-04-14 MED ORDER — ZEPBOUND 15 MG/0.5ML ~~LOC~~ SOAJ: 15.0000 mg | SUBCUTANEOUS | 2 refills | Status: AC

## 2024-04-14 MED ORDER — ATORVASTATIN CALCIUM 40 MG PO TABS: ORAL_TABLET | ORAL | 1 refills | Status: AC

## 2024-06-02 ENCOUNTER — Other Ambulatory Visit: Payer: Self-pay | Admitting: Cardiology

## 2024-06-02 DIAGNOSIS — I10 Essential (primary) hypertension: Secondary | ICD-10-CM
